# Patient Record
Sex: Female | Born: 1945
Health system: Southern US, Community
[De-identification: ages and names within clinical notes are randomized; demographics above are authoritative.]

## PROBLEM LIST (undated history)

## (undated) DIAGNOSIS — M1990 Unspecified osteoarthritis, unspecified site: Secondary | ICD-10-CM

## (undated) DIAGNOSIS — C801 Malignant (primary) neoplasm, unspecified: Secondary | ICD-10-CM

## (undated) DIAGNOSIS — Z85828 Personal history of other malignant neoplasm of skin: Secondary | ICD-10-CM

## (undated) DIAGNOSIS — E039 Hypothyroidism, unspecified: Secondary | ICD-10-CM

## (undated) DIAGNOSIS — F32A Depression, unspecified: Secondary | ICD-10-CM

## (undated) DIAGNOSIS — F329 Major depressive disorder, single episode, unspecified: Secondary | ICD-10-CM

## (undated) DIAGNOSIS — G5603 Carpal tunnel syndrome, bilateral upper limbs: Secondary | ICD-10-CM

## (undated) HISTORY — DX: Carpal tunnel syndrome, bilateral upper limbs: G56.03

---

## 1993-04-05 DIAGNOSIS — D229 Melanocytic nevi, unspecified: Secondary | ICD-10-CM

## 1993-04-05 HISTORY — DX: Melanocytic nevi, unspecified: D22.9

## 1996-01-08 DIAGNOSIS — C4491 Basal cell carcinoma of skin, unspecified: Secondary | ICD-10-CM

## 1996-01-08 HISTORY — DX: Basal cell carcinoma of skin, unspecified: C44.91

## 2000-01-04 DIAGNOSIS — D229 Melanocytic nevi, unspecified: Secondary | ICD-10-CM

## 2000-01-04 HISTORY — DX: Melanocytic nevi, unspecified: D22.9

## 2002-02-13 HISTORY — PX: BACK SURGERY: SHX140

## 2004-02-14 HISTORY — PX: KNEE ARTHROSCOPY: SHX127

## 2006-02-13 HISTORY — PX: CHOLECYSTECTOMY: SHX55

## 2007-05-16 DIAGNOSIS — C4492 Squamous cell carcinoma of skin, unspecified: Secondary | ICD-10-CM

## 2007-05-16 HISTORY — DX: Squamous cell carcinoma of skin, unspecified: C44.92

## 2009-04-07 DIAGNOSIS — C4492 Squamous cell carcinoma of skin, unspecified: Secondary | ICD-10-CM

## 2009-04-07 HISTORY — DX: Squamous cell carcinoma of skin, unspecified: C44.92

## 2012-08-07 DIAGNOSIS — R0789 Other chest pain: Secondary | ICD-10-CM

## 2012-10-08 ENCOUNTER — Encounter (INDEPENDENT_AMBULATORY_CARE_PROVIDER_SITE_OTHER): Payer: Self-pay | Admitting: *Deleted

## 2012-10-09 ENCOUNTER — Encounter (INDEPENDENT_AMBULATORY_CARE_PROVIDER_SITE_OTHER): Payer: Self-pay

## 2014-05-13 ENCOUNTER — Other Ambulatory Visit (INDEPENDENT_AMBULATORY_CARE_PROVIDER_SITE_OTHER): Payer: Self-pay | Admitting: Otolaryngology

## 2014-05-13 DIAGNOSIS — R221 Localized swelling, mass and lump, neck: Secondary | ICD-10-CM

## 2014-05-18 ENCOUNTER — Ambulatory Visit (HOSPITAL_COMMUNITY)
Admission: RE | Admit: 2014-05-18 | Discharge: 2014-05-18 | Disposition: A | Discharge status: Home / Self Care | Payer: Medicare Other | Source: Ambulatory Visit | Attending: Otolaryngology | Admitting: Otolaryngology

## 2014-05-18 DIAGNOSIS — R221 Localized swelling, mass and lump, neck: Secondary | ICD-10-CM | POA: Insufficient documentation

## 2014-05-18 LAB — POCT I-STAT CREATININE: Creatinine, Ser: 0.9 mg/dL (ref 0.50–1.10)

## 2014-05-18 IMAGING — CT CT NECK W/ CM
3 of 8 series · 10 of 33 positions shown, 11 images · IV contrast (Omnipaque 300)
Comparison: None.

CLINICAL DATA: Right neck mass.

EXAM:
CT NECK WITH CONTRAST
TECHNIQUE: Multidetector CT imaging of the neck was performed using the
standard protocol following the bolus administration of intravenous
contrast.
CONTRAST:  75mL OMNIPAQUE IOHEXOL 300 MG/ML  SOLN

[Series 2: soft tissue neck 2.0 b31s · axial · 0.47mm/px · z∈[+100,+232]mm · 4 of 111 slices shown, 5 images]
[im 23/111  soft-tissue]
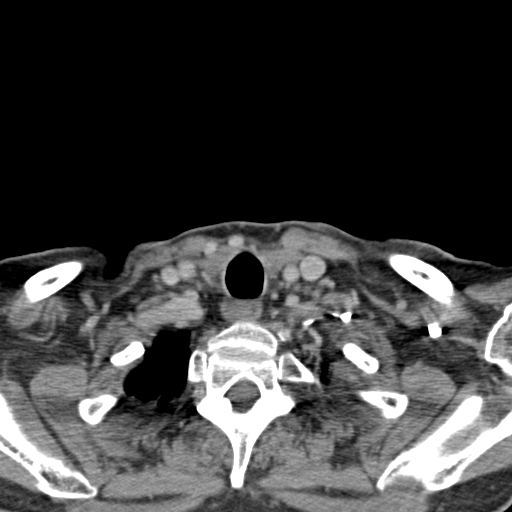
[im 23/111  bone]
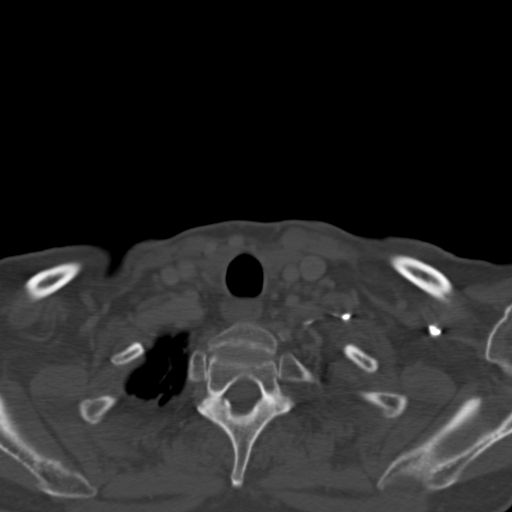
[im 45/111  bone]
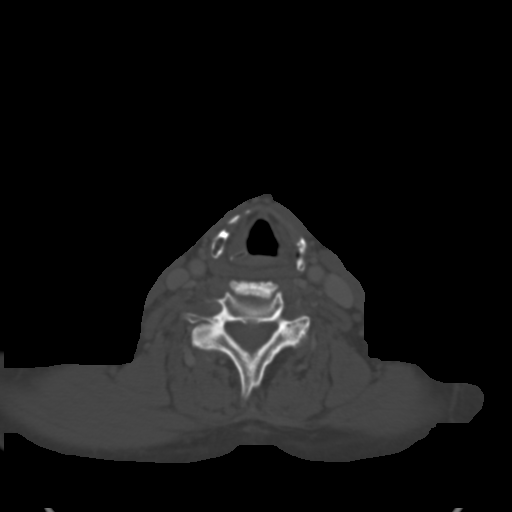
[im 67/111  bone]
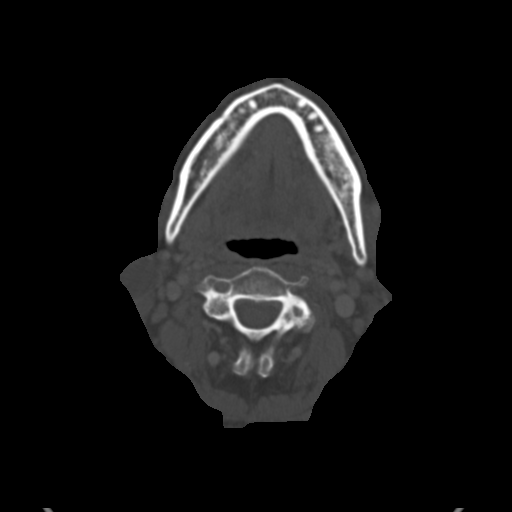
[im 89/111  bone]
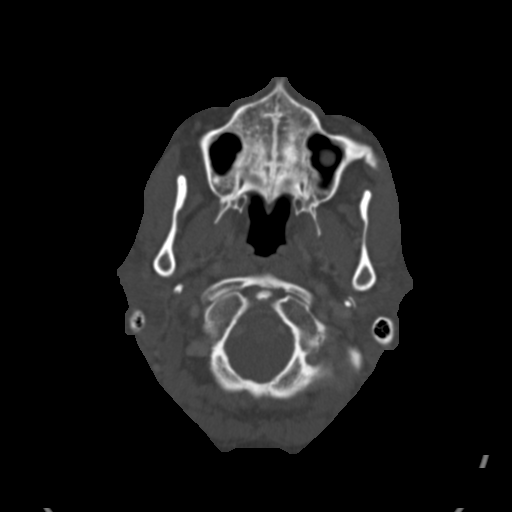

[Series 5: neck 2.0 soft tissue coro · coronal · 0.37mm/px · 1 of 115 slices shown]
[im 58/115  bone]
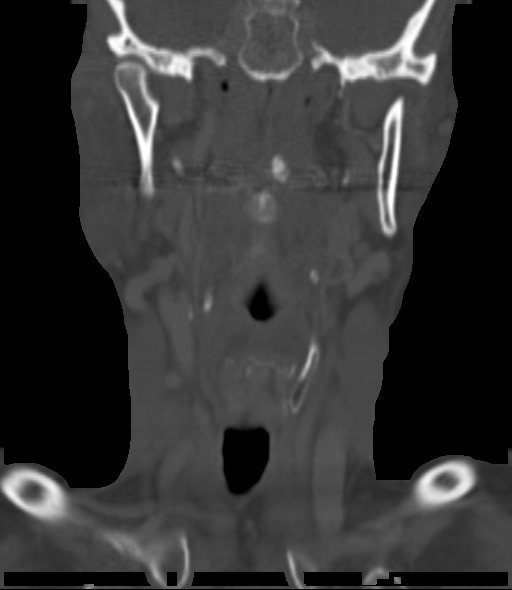

[Series 9: neck 2.0 soft tissue sag · sagittal · 0.14mm/px · 5 of 128 slices shown]
[im 19/128  bone]
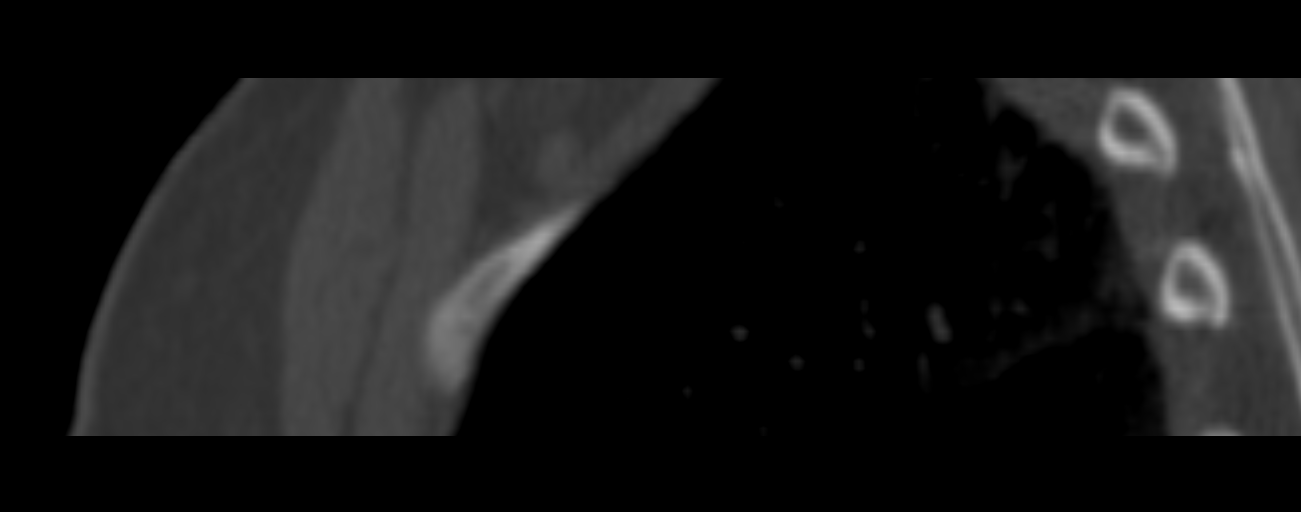
[im 37/128  bone]
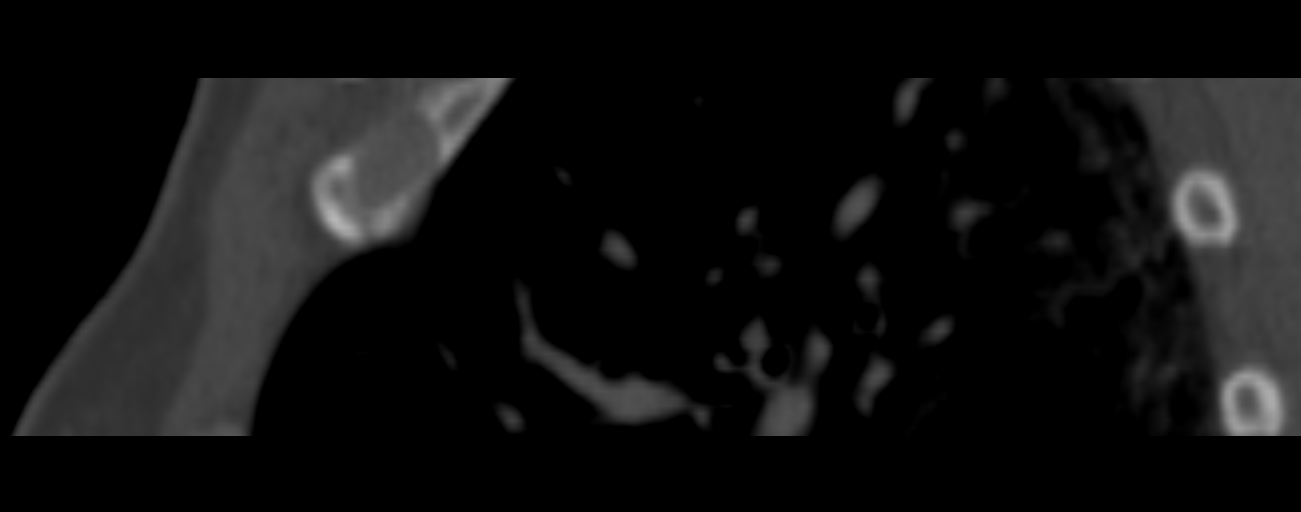
[im 55/128  bone]
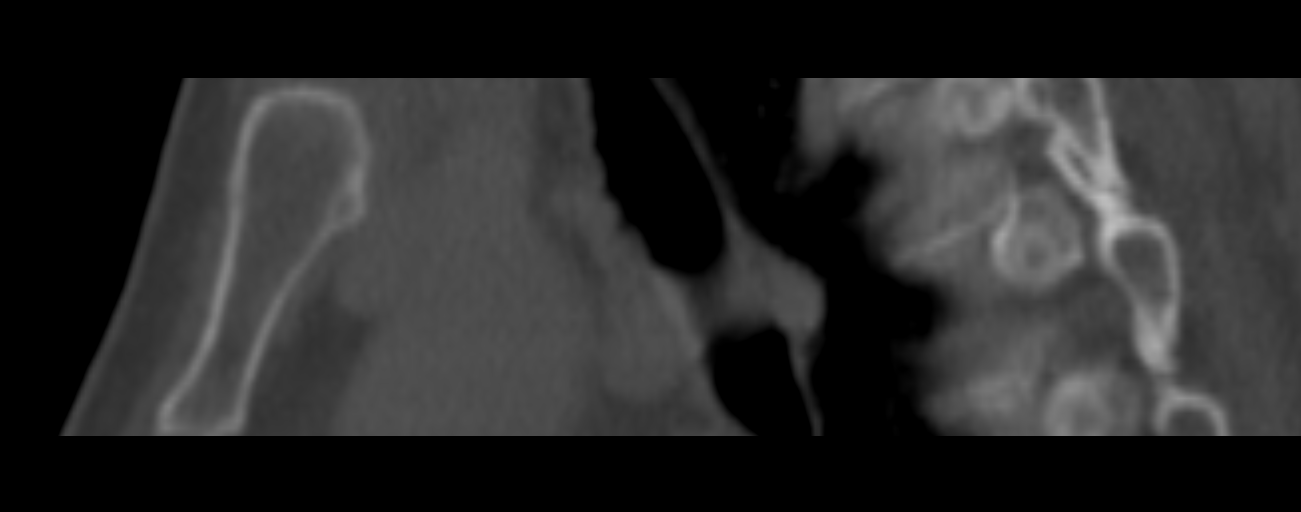
[im 73/128  bone]
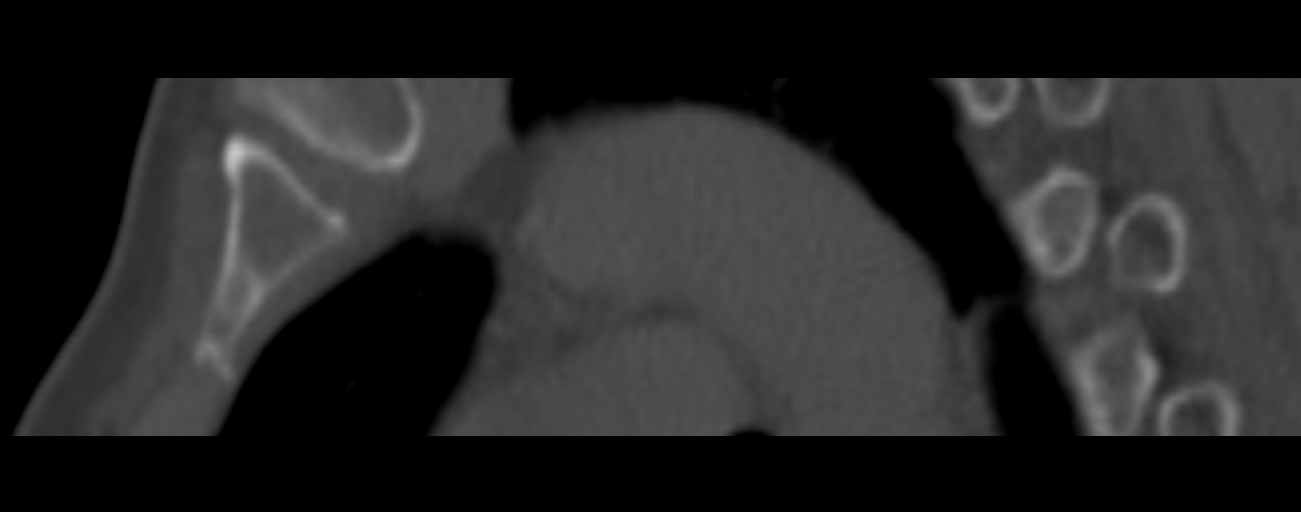
[im 91/128  bone]
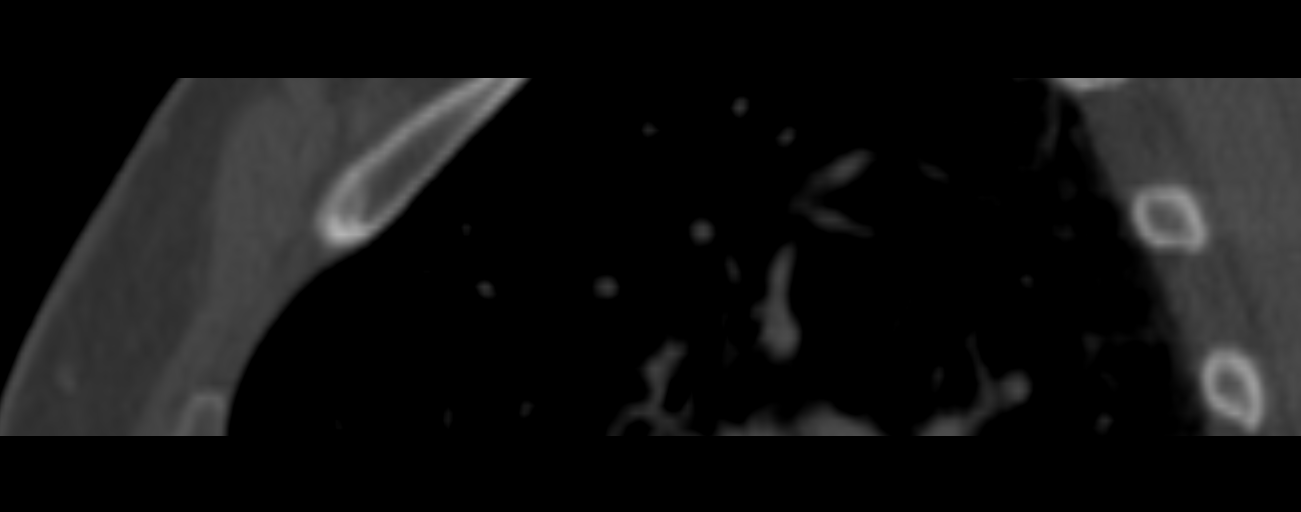

[10 of 33 positions shown; findings below may reference images not displayed]

FINDINGS: Pharynx and larynx: No focal mucosal or submucosal lesions are
present. The epiglottis is normal. The valleculae is clear. The
vocal cords are midline and symmetric.

Salivary glands: A peripherally enhancing low-density lesion is
present at the tail of the right parotid gland measuring 2.0 x 2.7 x
3.1 cm. There is no clear fat plane between this lesion in the
parotid gland. No other discrete parotid lesions are evident. The
submandibular glands are within normal limits bilaterally.

Thyroid: Negative

Lymph nodes: Multiple borderline posterior right level 2 lymph nodes
are present. A single borderline posterior left level 2 node
measures 11 mm in long axis. No significant lower adenopathy is
present.

Vascular: Mild atherosclerotic calcifications are present at the
carotid bifurcations bilaterally without significant stenoses.

Limited intracranial: Negative.

Visualized orbits: Within normal limits.

Mastoids and visualized paranasal sinuses: Clear.

Skeleton: Degenerative endplate changes are present at C5-6 and C6-7
with uncovertebral spurring bilaterally. No focal lytic or blastic
lesions are present.

Upper chest: Interlobular and pleural thickening is present at the
lung apices bilaterally, right greater than left. Mild emphysematous
changes are noted.
IMPRESSION: 1. 3.1 cm peripherally enhancing low-density lesion at the tail the
right parotid gland. This could represent a primary parotid lesion
such is benign mixed tumor or Warthin's tumor. There is no evidence
for malignancy aggressive features. This may represent a necrotic
lymph node. No other primary lesion is evident. The lesion is
somewhat high for and a second branchial cleft cyst. An infected
branchial cleft cyst is considered least likely.
2. Other borderline the posterior level 2 nodes are present
bilaterally, suggesting an inflammatory process.
3. Mild degenerative changes of the cervical spine at C5-6 and C6-7.

## 2014-05-18 MED ORDER — IOHEXOL 300 MG/ML  SOLN
75.0000 mL | Freq: Once | INTRAMUSCULAR | Status: AC | PRN
  Administered 2014-05-18: 75 mL via INTRAVENOUS

## 2014-05-21 ENCOUNTER — Ambulatory Visit (INDEPENDENT_AMBULATORY_CARE_PROVIDER_SITE_OTHER): Payer: Medicare Other | Admitting: Otolaryngology

## 2014-05-21 DIAGNOSIS — D37030 Neoplasm of uncertain behavior of the parotid salivary glands: Secondary | ICD-10-CM

## 2014-05-28 ENCOUNTER — Other Ambulatory Visit (HOSPITAL_COMMUNITY)
Admission: RE | Admit: 2014-05-28 | Discharge: 2014-05-28 | Disposition: A | Discharge status: Home / Self Care | Payer: Medicare Other | Source: Ambulatory Visit | Attending: Otolaryngology | Admitting: Otolaryngology

## 2014-05-28 ENCOUNTER — Ambulatory Visit (INDEPENDENT_AMBULATORY_CARE_PROVIDER_SITE_OTHER): Payer: Medicare Other | Admitting: Otolaryngology

## 2014-05-28 DIAGNOSIS — D37030 Neoplasm of uncertain behavior of the parotid salivary glands: Secondary | ICD-10-CM | POA: Diagnosis not present

## 2014-05-28 DIAGNOSIS — K118 Other diseases of salivary glands: Secondary | ICD-10-CM | POA: Diagnosis present

## 2014-07-02 ENCOUNTER — Ambulatory Visit (INDEPENDENT_AMBULATORY_CARE_PROVIDER_SITE_OTHER): Payer: Medicare Other | Admitting: Otolaryngology

## 2014-07-02 DIAGNOSIS — D37030 Neoplasm of uncertain behavior of the parotid salivary glands: Secondary | ICD-10-CM | POA: Diagnosis not present

## 2014-07-06 ENCOUNTER — Other Ambulatory Visit: Payer: Self-pay | Admitting: Otolaryngology

## 2014-07-15 ENCOUNTER — Encounter (HOSPITAL_BASED_OUTPATIENT_CLINIC_OR_DEPARTMENT_OTHER): Payer: Self-pay | Admitting: *Deleted

## 2014-07-20 ENCOUNTER — Ambulatory Visit (HOSPITAL_BASED_OUTPATIENT_CLINIC_OR_DEPARTMENT_OTHER): Payer: Medicare Other | Admitting: Anesthesiology

## 2014-07-20 ENCOUNTER — Encounter (HOSPITAL_BASED_OUTPATIENT_CLINIC_OR_DEPARTMENT_OTHER)
Admission: RE | Disposition: A | Discharge status: Home / Self Care | Payer: Self-pay | Source: Ambulatory Visit | Attending: Otolaryngology

## 2014-07-20 ENCOUNTER — Ambulatory Visit (HOSPITAL_BASED_OUTPATIENT_CLINIC_OR_DEPARTMENT_OTHER)
Admission: RE | Admit: 2014-07-20 | Discharge: 2014-07-21 | Disposition: A | Discharge status: Home / Self Care | Payer: Medicare Other | Source: Ambulatory Visit | Attending: Otolaryngology | Admitting: Otolaryngology

## 2014-07-20 ENCOUNTER — Encounter (HOSPITAL_BASED_OUTPATIENT_CLINIC_OR_DEPARTMENT_OTHER): Payer: Self-pay

## 2014-07-20 DIAGNOSIS — D37030 Neoplasm of uncertain behavior of the parotid salivary glands: Secondary | ICD-10-CM | POA: Diagnosis not present

## 2014-07-20 DIAGNOSIS — F329 Major depressive disorder, single episode, unspecified: Secondary | ICD-10-CM | POA: Diagnosis not present

## 2014-07-20 DIAGNOSIS — C07 Malignant neoplasm of parotid gland: Secondary | ICD-10-CM | POA: Diagnosis not present

## 2014-07-20 DIAGNOSIS — Z9889 Other specified postprocedural states: Secondary | ICD-10-CM

## 2014-07-20 DIAGNOSIS — Z9049 Acquired absence of other specified parts of digestive tract: Secondary | ICD-10-CM | POA: Insufficient documentation

## 2014-07-20 DIAGNOSIS — E039 Hypothyroidism, unspecified: Secondary | ICD-10-CM | POA: Insufficient documentation

## 2014-07-20 DIAGNOSIS — R221 Localized swelling, mass and lump, neck: Secondary | ICD-10-CM | POA: Diagnosis present

## 2014-07-20 HISTORY — PX: PAROTIDECTOMY: SHX2163

## 2014-07-20 HISTORY — DX: Hypothyroidism, unspecified: E03.9

## 2014-07-20 HISTORY — DX: Major depressive disorder, single episode, unspecified: F32.9

## 2014-07-20 HISTORY — DX: Depression, unspecified: F32.A

## 2014-07-20 LAB — POCT HEMOGLOBIN-HEMACUE: HEMOGLOBIN: 13.4 g/dL (ref 12.0–15.0)

## 2014-07-20 SURGERY — EXCISION, PAROTID GLAND
Anesthesia: General | Laterality: Right

## 2014-07-20 MED ORDER — FENTANYL CITRATE (PF) 100 MCG/2ML IJ SOLN
INTRAMUSCULAR | Status: DC | PRN
  Administered 2014-07-20: 100 ug via INTRAVENOUS
  Administered 2014-07-20: 50 ug via INTRAVENOUS

## 2014-07-20 MED ORDER — CEFAZOLIN SODIUM-DEXTROSE 2-3 GM-% IV SOLR
INTRAVENOUS | Status: DC | PRN
  Administered 2014-07-20: 2 g via INTRAVENOUS

## 2014-07-20 MED ORDER — FENTANYL CITRATE (PF) 100 MCG/2ML IJ SOLN
INTRAMUSCULAR | Status: AC
  Filled 2014-07-20: qty 6

## 2014-07-20 MED ORDER — LEVOTHYROXINE SODIUM 112 MCG PO TABS: 112.0000 ug | ORAL_TABLET | Freq: Every day | ORAL | Status: DC

## 2014-07-20 MED ORDER — FENTANYL CITRATE (PF) 100 MCG/2ML IJ SOLN
25.0000 ug | INTRAMUSCULAR | Status: DC | PRN
  Administered 2014-07-20: 50 ug via INTRAVENOUS

## 2014-07-20 MED ORDER — PHENYLEPHRINE HCL 10 MG/ML IJ SOLN
10.0000 mg | INTRAVENOUS | Status: DC | PRN
  Administered 2014-07-20: 50 ug/min via INTRAVENOUS

## 2014-07-20 MED ORDER — EPHEDRINE SULFATE 50 MG/ML IJ SOLN
INTRAMUSCULAR | Status: DC | PRN
  Administered 2014-07-20 (×3): 10 mg via INTRAVENOUS

## 2014-07-20 MED ORDER — MIDAZOLAM HCL 2 MG/2ML IJ SOLN
INTRAMUSCULAR | Status: AC
  Filled 2014-07-20: qty 2

## 2014-07-20 MED ORDER — OXYCODONE-ACETAMINOPHEN 5-325 MG PO TABS
1.0000 | ORAL_TABLET | ORAL | Status: DC | PRN
  Administered 2014-07-20: 2 via ORAL
  Filled 2014-07-20: qty 2

## 2014-07-20 MED ORDER — ONDANSETRON HCL 4 MG/2ML IJ SOLN
INTRAMUSCULAR | Status: DC | PRN
  Administered 2014-07-20: 4 mg via INTRAVENOUS

## 2014-07-20 MED ORDER — OXYCODONE-ACETAMINOPHEN 5-325 MG PO TABS: 1.0000 | ORAL_TABLET | ORAL | Status: DC | PRN

## 2014-07-20 MED ORDER — KCL IN DEXTROSE-NACL 20-5-0.45 MEQ/L-%-% IV SOLN
INTRAVENOUS | Status: DC
  Administered 2014-07-20: 14:00:00 via INTRAVENOUS
  Filled 2014-07-20: qty 1000

## 2014-07-20 MED ORDER — DEXAMETHASONE SODIUM PHOSPHATE 4 MG/ML IJ SOLN
INTRAMUSCULAR | Status: DC | PRN
  Administered 2014-07-20: 10 mg via INTRAVENOUS

## 2014-07-20 MED ORDER — OXYCODONE HCL 5 MG PO TABS: 5.0000 mg | ORAL_TABLET | Freq: Once | ORAL | Status: AC | PRN

## 2014-07-20 MED ORDER — ACETAMINOPHEN 325 MG PO TABS: 325.0000 mg | ORAL_TABLET | ORAL | Status: DC | PRN

## 2014-07-20 MED ORDER — FENTANYL CITRATE (PF) 100 MCG/2ML IJ SOLN
INTRAMUSCULAR | Status: AC
  Filled 2014-07-20: qty 2

## 2014-07-20 MED ORDER — PROPOFOL 10 MG/ML IV BOLUS
INTRAVENOUS | Status: AC
  Filled 2014-07-20: qty 20

## 2014-07-20 MED ORDER — AMOXICILLIN 875 MG PO TABS: 875.0000 mg | ORAL_TABLET | Freq: Two times a day (BID) | ORAL | Status: DC

## 2014-07-20 MED ORDER — GLYCOPYRROLATE 0.2 MG/ML IJ SOLN
INTRAMUSCULAR | Status: DC | PRN
  Administered 2014-07-20: 0.2 mg via INTRAVENOUS

## 2014-07-20 MED ORDER — MIDAZOLAM HCL 5 MG/5ML IJ SOLN
INTRAMUSCULAR | Status: DC | PRN
  Administered 2014-07-20: 2 mg via INTRAVENOUS

## 2014-07-20 MED ORDER — ACETAMINOPHEN 160 MG/5ML PO SOLN: 325.0000 mg | ORAL | Status: DC | PRN

## 2014-07-20 MED ORDER — MORPHINE SULFATE 2 MG/ML IJ SOLN: 2.0000 mg | INTRAMUSCULAR | Status: DC | PRN

## 2014-07-20 MED ORDER — OXYCODONE HCL 5 MG/5ML PO SOLN: 5.0000 mg | Freq: Once | ORAL | Status: AC | PRN

## 2014-07-20 MED ORDER — LIDOCAINE-EPINEPHRINE 1 %-1:100000 IJ SOLN
INTRAMUSCULAR | Status: DC | PRN
  Administered 2014-07-20: 2 mL

## 2014-07-20 MED ORDER — ZOLPIDEM TARTRATE 5 MG PO TABS: 5.0000 mg | ORAL_TABLET | Freq: Every evening | ORAL | Status: DC | PRN

## 2014-07-20 MED ORDER — ONDANSETRON HCL 4 MG/2ML IJ SOLN: 4.0000 mg | INTRAMUSCULAR | Status: DC | PRN

## 2014-07-20 MED ORDER — PROPOFOL 10 MG/ML IV BOLUS
INTRAVENOUS | Status: DC | PRN
  Administered 2014-07-20: 150 mg via INTRAVENOUS

## 2014-07-20 MED ORDER — LACTATED RINGERS IV SOLN
INTRAVENOUS | Status: DC
  Administered 2014-07-20 (×2): via INTRAVENOUS

## 2014-07-20 MED ORDER — SUCCINYLCHOLINE CHLORIDE 20 MG/ML IJ SOLN
INTRAMUSCULAR | Status: DC | PRN
  Administered 2014-07-20: 100 mg via INTRAVENOUS

## 2014-07-20 MED ORDER — VENLAFAXINE HCL 75 MG PO TABS: 75.0000 mg | ORAL_TABLET | Freq: Two times a day (BID) | ORAL | Status: DC

## 2014-07-20 MED ORDER — ONDANSETRON HCL 4 MG PO TABS: 4.0000 mg | ORAL_TABLET | ORAL | Status: DC | PRN

## 2014-07-20 SURGICAL SUPPLY — 60 items
APL SRG 3 HI ABS STRL LF PLS (MISCELLANEOUS) ×1
APPLICATOR DR MATTHEWS STRL (MISCELLANEOUS) ×3 IMPLANT
ATTRACTOMAT 16X20 MAGNETIC DRP (DRAPES) ×1 IMPLANT
BALL CTTN LRG ABS STRL LF (GAUZE/BANDAGES/DRESSINGS) ×1
BLADE SURG 15 STRL LF DISP TIS (BLADE) ×1 IMPLANT
BLADE SURG 15 STRL SS (BLADE) ×3
CANISTER SUCT 1200ML W/VALVE (MISCELLANEOUS) ×3 IMPLANT
CORDS BIPOLAR (ELECTRODE) ×3 IMPLANT
COTTONBALL LRG STERILE PKG (GAUZE/BANDAGES/DRESSINGS) ×3 IMPLANT
COVER BACK TABLE 60X90IN (DRAPES) ×3 IMPLANT
COVER MAYO STAND STRL (DRAPES) ×3 IMPLANT
DECANTER SPIKE VIAL GLASS SM (MISCELLANEOUS) ×3 IMPLANT
DRAIN CHANNEL 10F 3/8 F FF (DRAIN) ×2 IMPLANT
DRAPE SURG 17X23 STRL (DRAPES) ×4 IMPLANT
DRAPE U-SHAPE 76X120 STRL (DRAPES) ×3 IMPLANT
ELECT COATED BLADE 2.86 ST (ELECTRODE) ×3 IMPLANT
ELECT PAIRED SUBDERMAL (MISCELLANEOUS) ×3
ELECT REM PT RETURN 9FT ADLT (ELECTROSURGICAL) ×3
ELECTRODE PAIRED SUBDERMAL (MISCELLANEOUS) ×1 IMPLANT
ELECTRODE REM PT RTRN 9FT ADLT (ELECTROSURGICAL) ×1 IMPLANT
EVACUATOR SILICONE 100CC (DRAIN) ×2 IMPLANT
GAUZE SPONGE 4X4 16PLY XRAY LF (GAUZE/BANDAGES/DRESSINGS) ×3 IMPLANT
GLOVE BIO SURGEON STRL SZ 6.5 (GLOVE) IMPLANT
GLOVE BIO SURGEON STRL SZ7.5 (GLOVE) ×3 IMPLANT
GLOVE BIO SURGEONS STRL SZ 6.5 (GLOVE)
GLOVE BIOGEL PI IND STRL 7.0 (GLOVE) IMPLANT
GLOVE BIOGEL PI INDICATOR 7.0 (GLOVE) ×4
GLOVE ECLIPSE 6.5 STRL STRAW (GLOVE) ×2 IMPLANT
GOWN STRL REUS W/ TWL LRG LVL3 (GOWN DISPOSABLE) ×2 IMPLANT
GOWN STRL REUS W/TWL LRG LVL3 (GOWN DISPOSABLE) ×9
LIQUID BAND (GAUZE/BANDAGES/DRESSINGS) ×3 IMPLANT
LOCATOR NERVE 3 VOLT (DISPOSABLE) IMPLANT
NDL HYPO 25X1 1.5 SAFETY (NEEDLE) ×1 IMPLANT
NEEDLE HYPO 25X1 1.5 SAFETY (NEEDLE) ×3 IMPLANT
NS IRRIG 1000ML POUR BTL (IV SOLUTION) ×3 IMPLANT
PACK BASIN DAY SURGERY FS (CUSTOM PROCEDURE TRAY) ×3 IMPLANT
PAD ALCOHOL SWAB (MISCELLANEOUS) ×6 IMPLANT
PENCIL BUTTON HOLSTER BLD 10FT (ELECTRODE) ×3 IMPLANT
PIN SAFETY STERILE (MISCELLANEOUS) IMPLANT
PROBE NERVBE PRASS .33 (MISCELLANEOUS) ×3 IMPLANT
SHEARS HARMONIC 9CM CVD (BLADE) ×3 IMPLANT
SLEEVE SCD COMPRESS KNEE MED (MISCELLANEOUS) ×3 IMPLANT
SPONGE INTESTINAL PEANUT (DISPOSABLE) ×3 IMPLANT
STAPLER VISISTAT 35W (STAPLE) IMPLANT
SUT ETHILON 3 0 PS 1 (SUTURE) ×2 IMPLANT
SUT PROLENE 5 0 P 3 (SUTURE) ×3 IMPLANT
SUT SILK 2 0 FS (SUTURE) ×9 IMPLANT
SUT SILK 2 0 TIES 17X18 (SUTURE)
SUT SILK 2-0 18XBRD TIE BLK (SUTURE) IMPLANT
SUT SILK 3 0 TIES 17X18 (SUTURE) ×3
SUT SILK 3-0 18XBRD TIE BLK (SUTURE) ×1 IMPLANT
SUT SILK 4 0 TIES 17X18 (SUTURE) IMPLANT
SUT VIC AB 3-0 FS2 27 (SUTURE) IMPLANT
SUT VICRYL 4-0 PS2 18IN ABS (SUTURE) ×5 IMPLANT
SYR BULB 3OZ (MISCELLANEOUS) ×3 IMPLANT
SYR CONTROL 10ML LL (SYRINGE) ×3 IMPLANT
TOWEL OR 17X24 6PK STRL BLUE (TOWEL DISPOSABLE) ×5 IMPLANT
TRAY DSU PREP LF (CUSTOM PROCEDURE TRAY) ×3 IMPLANT
TUBE CONNECTING 20'X1/4 (TUBING) ×1
TUBE CONNECTING 20X1/4 (TUBING) ×2 IMPLANT

## 2014-07-20 NOTE — Discharge Instructions (Signed)
Parotidectomy Care After Refer to this sheet in the next few weeks. These instructions provide you with information on caring for yourself after your procedure. Your caregiver may also give you more specific instructions. Your treatment has been planned according to current medical practices, but problems sometimes occur. Call your caregiver if you have any problems or questions after your procedure. HOME CARE INSTRUCTIONS Wound care  Check your incision every day to make sure that it is not red.  Your stitches will be taken out after about 1 week. Pain  Some pain is normal after a parotidectomy. Take whatever pain medicine your surgeon prescribes. Follow the directions carefully. Diet  You can eat like you normally do once you are home. However, it might hurt to chew for a while. Stay away from foods that are hard to chew.  It may help to take your pain medicine about 30 minutes before you eat. Other precautions  Keep your head propped up when you lie down. Try using 2 pillows to do this. Do it for about 2 weeks after your surgery.  You can probably go back to your normal routine after a few days.However, do not do anything that requires great effort until your surgeon says it is okay. SEEK MEDICAL CARE IF:  You have any questions about your medicines.  Pain does not go away, even after taking pain medicine.  You vomit or feel nauseous. SEEK IMMEDIATE MEDICAL CARE IF:   You are taking pain medicine but your pain gets much worse.  Yourincision looks red and swollen or blood or fluid leaks from the wound.  Skin on your ear or face gets red and swollen.  Your face is numb or feels weak.  You have a fever. MAKE SURE YOU:  Understand these instructions.  Will watch your condition.  Will get help right away if you are not doing well or get worse. Document Released: 03/04/2010 Document Revised: 04/24/2011 Document Reviewed: 03/04/2010 [facility] Patient Information 2015  [place], [place]. This information is not intended to replace advice given to you by your health care provider. Make sure you discuss any questions you have with your health care provider.

## 2014-07-20 NOTE — Anesthesia Procedure Notes (Signed)
Procedure Name: Intubation Date/Time: 07/20/2014 10:08 AM Performed by: Maryella Shivers Pre-anesthesia Checklist: Patient identified, Emergency Drugs available, Suction available and Patient being monitored Patient Re-evaluated:Patient Re-evaluated prior to inductionOxygen Delivery Method: Circle System Utilized Preoxygenation: Pre-oxygenation with 100% oxygen Intubation Type: IV induction Ventilation: Mask ventilation without difficulty Laryngoscope Size: Mac and 3 Grade View: Grade I Tube type: Oral Tube size: 7.0 mm Number of attempts: 1 Airway Equipment and Method: Stylet and Oral airway Placement Confirmation: ETT inserted through vocal cords under direct vision,  positive ETCO2 and breath sounds checked- equal and bilateral Secured at: 21 cm Tube secured with: Tape Dental Injury: Teeth and Oropharynx as per pre-operative assessment

## 2014-07-20 NOTE — H&P (Signed)
H&P Update  Pt's original H&P dated 07/02/14 reviewed and placed in chart (to be scanned).  I personally examined the patient today.  No change in health. Proceed with right lateral parotidectomy.

## 2014-07-20 NOTE — Op Note (Signed)
DATE OF PROCEDURE:  07/20/2014                              OPERATIVE REPORT  SURGEON:  Leta Baptist, MD  ASSISTANT SURGEON: Rometta Emery, PA-C  PREOPERATIVE DIAGNOSES: Right parotid mass.  POSTOPERATIVE DIAGNOSES: Right parotid mass.  PROCEDURE PERFORMED:  Right lateral parotidectomy with facial nerve dissection  ANESTHESIA:  General endotracheal tube anesthesia.  COMPLICATIONS:  None.  ESTIMATED BLOOD LOSS:  Minimal.  INDICATION FOR PROCEDURE:  Maria Rogers is a 69 y.o. female with a history of a large 3 cm right parotid mass. Her FNA biopsy was negative for malignancy. However due to the large size of the mass, the patient would like to have the mass removed. The risks, benefits, alternatives, and details of the procedure were discussed with the patient.  Questions were invited and answered.  Informed consent was obtained.  DESCRIPTION:  The patient was taken to the operating room and placed supine on the operating table.  General endotracheal tube anesthesia was administered by the anesthesiologist.  Preop IV antibiotics was given.  The patient was positioned and prepped and draped in the standard fashion for right parotidectomy surgery.   Facial nerve monitoring electrodes were placed. The facial nerve monitoring system was functional throughout the case. 1% lidocaine with 1-100,000 epinephrine was injected at the planned site of incision. A curved S-shaped facelift incision was made in a standard fashion. The SMAS flap was elevated. A 3 cm tail of the right parotid mass was noted. Dissection was carried out in the preauricular space, down to the level of the facial nerve. The main trunk of the facial nerve and the branches of the right facial nerve were identified and dissected free from the parotid tissue. All branches of the facial nerve were preserved. The entire mass was then dissected free from the rest of the parotid gland. The specimen was sent to the pathology department for  permanent histologic identification. A #10 JP drain was placed. The incision was closed in layers with 4-0 Vicryls and Dermabond.   The care of the patient was turned over to the anesthesiologist.  The patient was awakened from anesthesia without difficulty.  The patient was extubated and transferred to the recovery room in good condition.  OPERATIVE FINDINGS: A 3 cm right parotid mass was noted and removed.  The right facial nerve was identified and preserved.   SPECIMEN:  Right parotid mass   FOLLOWUP CARE:  The patient will be observed overnight in the surgery center. She will most likely be discharged home on postop day #1. She will follow up in my office in one week.   Maria Rogers 07/20/2014 12:02 PM

## 2014-07-20 NOTE — Anesthesia Postprocedure Evaluation (Signed)
Anesthesia Post Note  Patient: Maria Rogers  Procedure(s) Performed: Procedure(s) (LRB): PAROTIDECTOMY (Right)  Anesthesia type: General  Patient location: PACU  Post pain: Pain level controlled  Post assessment: Post-op Vital signs reviewed  Last Vitals: BP 141/77 mmHg  Pulse 78  Temp(Src) 36.7 C (Oral)  Resp 16  Ht 5\' 8"  (1.727 m)  Wt 167 lb (75.751 kg)  BMI 25.40 kg/m2  SpO2 95%  Post vital signs: Reviewed  Level of consciousness: sedated  Complications: No apparent anesthesia complications

## 2014-07-20 NOTE — Anesthesia Preprocedure Evaluation (Signed)
Anesthesia Evaluation  Patient identified by MRN, date of birth, ID band Patient awake    Reviewed: Allergy & Precautions, NPO status , Patient's Chart, lab work & pertinent test results  History of Anesthesia Complications Negative for: history of anesthetic complications  Airway Mallampati: II  TM Distance: >3 FB Neck ROM: Full    Dental  (+) Teeth Intact   Pulmonary neg pulmonary ROS,  breath sounds clear to auscultation        Cardiovascular negative cardio ROS  Rhythm:Regular     Neuro/Psych PSYCHIATRIC DISORDERS Depression negative neurological ROS     GI/Hepatic Neg liver ROS,   Endo/Other  Hypothyroidism   Renal/GU      Musculoskeletal   Abdominal   Peds  Hematology negative hematology ROS (+)   Anesthesia Other Findings   Reproductive/Obstetrics                             Anesthesia Physical Anesthesia Plan  ASA: II  Anesthesia Plan: General   Post-op Pain Management:    Induction: Intravenous  Airway Management Planned: Oral ETT  Additional Equipment: None  Intra-op Plan:   Post-operative Plan: Extubation in OR  Informed Consent: I have reviewed the patients History and Physical, chart, labs and discussed the procedure including the risks, benefits and alternatives for the proposed anesthesia with the patient or authorized representative who has indicated his/her understanding and acceptance.   Dental advisory given  Plan Discussed with: CRNA and Surgeon  Anesthesia Plan Comments:         Anesthesia Quick Evaluation

## 2014-07-20 NOTE — Transfer of Care (Signed)
Immediate Anesthesia Transfer of Care Note  Patient: Maria Rogers  Procedure(s) Performed: Procedure(s): PAROTIDECTOMY (Right)  Patient Location: PACU  Anesthesia Type:General  Level of Consciousness: awake, alert  and oriented  Airway & Oxygen Therapy: Patient Spontanous Breathing and Patient connected to face mask oxygen  Post-op Assessment: Report given to RN and Post -op Vital signs reviewed and stable  Post vital signs: Reviewed and stable  Last Vitals:  Filed Vitals:   07/20/14 1242  BP:   Pulse: 82  Temp:   Resp: 9    Complications: No apparent anesthesia complications

## 2014-07-21 ENCOUNTER — Encounter (HOSPITAL_BASED_OUTPATIENT_CLINIC_OR_DEPARTMENT_OTHER): Payer: Self-pay | Admitting: Otolaryngology

## 2014-07-21 NOTE — Discharge Summary (Signed)
Physician Discharge Summary  Patient ID: Maria Rogers MRN: [id number] DOB/AGE: [birth year] 69 y.o.  Admit date: 07/20/2014 Discharge date: 07/21/2014  Admission Diagnoses: Right parotid mass  Discharge Diagnoses: Right parotid mass Active Problems:   H/O superficial parotidectomy   Discharged Condition: good  Hospital Course: Pt had an uneventful overnight stay. Pt tolerated po well. No bleeding. No stridor. Facial nerve function intact bilaterally.  Consults: None  Significant Diagnostic Studies: None  Treatments: surgery: Right lateral parotidectomy  Discharge Exam: Blood pressure 126/77, pulse 72, temperature 97.7 F (36.5 C), temperature source Oral, resp. rate 16, height 5\' 8"  (1.727 m), weight 75.751 kg (167 lb), SpO2 99 %. Incision c/d/i CN 7 intact  Disposition: Final discharge disposition not confirmed  Discharge Instructions    Activity as tolerated - No restrictions    Complete by:  As directed      Diet general    Complete by:  As directed             Medication List    TAKE these medications        amoxicillin 875 MG tablet  Commonly known as:  AMOXIL  Take 1 tablet (875 mg total) by mouth 2 (two) times daily.     levothyroxine 112 MCG tablet  Commonly known as:  SYNTHROID, LEVOTHROID  Take 112 mcg by mouth daily before breakfast.     oxyCODONE-acetaminophen 5-325 MG per tablet  Commonly known as:  ROXICET  Take 1-2 tablets by mouth every 4 (four) hours as needed for severe pain.     venlafaxine 75 MG tablet  Commonly known as:  EFFEXOR  Take 75 mg by mouth 2 (two) times daily with a meal.           Follow-up Information    Follow up with Ascencion Dike, MD On 07/30/2014.   Specialty:  Otolaryngology   Why:  ay 3:50pm   Contact information:   [address] [place]  [address] [phone]       Signed: Ascencion Dike 07/21/2014, 8:05 AM

## 2014-07-22 ENCOUNTER — Other Ambulatory Visit (INDEPENDENT_AMBULATORY_CARE_PROVIDER_SITE_OTHER): Payer: Self-pay | Admitting: Otolaryngology

## 2014-07-22 DIAGNOSIS — K118 Other diseases of salivary glands: Secondary | ICD-10-CM

## 2014-07-23 ENCOUNTER — Ambulatory Visit (INDEPENDENT_AMBULATORY_CARE_PROVIDER_SITE_OTHER): Payer: Medicare Other | Admitting: Otolaryngology

## 2014-07-23 DIAGNOSIS — C770 Secondary and unspecified malignant neoplasm of lymph nodes of head, face and neck: Secondary | ICD-10-CM

## 2014-07-23 DIAGNOSIS — H6121 Impacted cerumen, right ear: Secondary | ICD-10-CM | POA: Diagnosis not present

## 2014-07-30 ENCOUNTER — Ambulatory Visit (HOSPITAL_COMMUNITY)
Admission: RE | Admit: 2014-07-30 | Discharge: 2014-07-30 | Disposition: A | Discharge status: Home / Self Care | Payer: Medicare Other | Source: Ambulatory Visit | Attending: Otolaryngology | Admitting: Otolaryngology

## 2014-07-30 DIAGNOSIS — K118 Other diseases of salivary glands: Secondary | ICD-10-CM

## 2014-07-30 DIAGNOSIS — C07 Malignant neoplasm of parotid gland: Secondary | ICD-10-CM | POA: Diagnosis not present

## 2014-07-30 LAB — GLUCOSE, CAPILLARY: GLUCOSE-CAPILLARY: 97 mg/dL (ref 65–99)

## 2014-07-30 MED ORDER — FLUDEOXYGLUCOSE F - 18 (FDG) INJECTION
8.3600 | Freq: Once | INTRAVENOUS | Status: AC | PRN
  Administered 2014-07-30: 8.36 via INTRAVENOUS

## 2014-08-03 ENCOUNTER — Encounter (HOSPITAL_COMMUNITY): Payer: Self-pay | Admitting: Hematology & Oncology

## 2014-08-03 ENCOUNTER — Encounter (HOSPITAL_COMMUNITY): Payer: Medicare Other | Attending: Hematology & Oncology | Admitting: Hematology & Oncology

## 2014-08-03 VITALS — BP 154/74 | HR 77 | Temp 98.7°F | Resp 16 | Ht 68.0 in | Wt 167.6 lb

## 2014-08-03 DIAGNOSIS — Z85828 Personal history of other malignant neoplasm of skin: Secondary | ICD-10-CM

## 2014-08-03 DIAGNOSIS — C07 Malignant neoplasm of parotid gland: Secondary | ICD-10-CM | POA: Diagnosis not present

## 2014-08-03 DIAGNOSIS — C4491 Basal cell carcinoma of skin, unspecified: Secondary | ICD-10-CM

## 2014-08-03 NOTE — Progress Notes (Signed)
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 It is uncertain if this was a primary versus metastatic disease. Prior to surgical resection on 6/6 she underwent an FNA that was benign. The patient also notes prior to seeing Dr. Benjamine Mola she was treated with 2 courses of Antibiotics with no improvements in the size of the mass. She was presented at head and neck tumor Board in [place] where evaluation for other sites of disease was recommended. PET CT was performed on 07/30/2014 with no evidence of nodal metastases within the neck, no evidence of distant metastases. There was evidence of post excisional biopsy inflammation, and a round lesion was noted in the left parotid gland that had no metabolic activity and was considered to be benign. Additional workup was recommended. She follows currently with Dr. Benjamine Mola.   She is present today with her son Legrand Como.  She says that she feels no pain and has done well with surgery.  She says that she otherwise feels well.  She has had several basal skin cancer's all removed. Her most recent was 2 weeks ago, Dr. Denna Haggard, Dermatologist.  Doctors in [place] have done them as well.  She says that she has had a lot of sun exposure as a child.  She is fair skinned with blue eyes. She has had "freezing" done in several areas of her face including in the middle of her scalp. She denies any history of squamous cell carcinoma of the skin.  She has experienced mild weight loss due to change of her thyroid medicine; she has been taking it since 1993.  Last saw Dr. Benjamine Mola, ENT,  early last week. She notes she was advised her surgery was complete.   She has yearly mammograms She has had a colonscopy, Dr. Britta Mccreedy, [place] States that she has never had an EGD  She is here today for additional discussion and treatment planning in regards to her newly diagnosed squamous cell carcinoma. Pathology reveals a squamous cell carcinoma felt to be metastatic and involving a lymph node.  MEDICAL HISTORY:  Past Medical History  Diagnosis Date  . Hypothyroidism   . Depression   . Parotid mass 2016    SURGICAL HISTORY: Past Surgical History  Procedure Laterality Date  . Back surgery  2004    2 disc in neck  . Cholecystectomy  2008    lap chole  . Knee arthroscopy Right 2006  . Parotidectomy Right 07/20/2014    Procedure: PAROTIDECTOMY;  Surgeon: Leta Baptist, MD;  Location: [facility];  Service: ENT;  Laterality: Right;    SOCIAL HISTORY: History   Social History  . Marital Status: Married    Spouse Name: N/A  . Number of Children: N/A  . Years of Education: N/A   Occupational History  . Not on file.   Social History Main Topics  . Smoking status: Never Smoker   . Smokeless tobacco: Never Used  . Alcohol Use: No  . Drug Use: No  . Sexual Activity: Not Currently   Other Topics Concern  . 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 Cancer Mother    indicated that her mother is deceased. She indicated that her father is deceased. She indicated that her sister is deceased. She indicated that her brother is alive. She indicated that her maternal grandmother is deceased. She indicated that her maternal grandfather is deceased. She indicated that her paternal grandmother is deceased. She indicated that her paternal grandfather is deceased.   Mother deceased, 91, colon cancer, Basal Cell Carinoma Father deceaed, 71, heart attack and thyroid cancer Sister deceased 21, pulmonary edema, ETOH problems Brother living 3 children, 6 grandchildren   ALLERGIES:  has No Known Allergies.  MEDICATIONS:  Current Outpatient Prescriptions  Medication Sig Dispense Refill  . acetaminophen (TYLENOL) 325 MG tablet Take 650 mg by mouth at bedtime as needed.    Marland Kitchen levothyroxine (SYNTHROID, LEVOTHROID) 112 MCG tablet Take 112 mcg by mouth daily before breakfast.    . venlafaxine (EFFEXOR) 75 MG tablet Take 75 mg by mouth 2 (two) times daily with a meal.    . amoxicillin (AMOXIL) 875 MG tablet Take 1 tablet (875 mg total) by mouth 2 (two) times daily. (Patient not taking: Reported on 08/03/2014) 10 tablet 0  . oxyCODONE-acetaminophen (ROXICET) 5-325 MG per tablet Take 1-2 tablets by mouth every 4 (four) hours as needed for severe pain. (Patient not taking: Reported on 08/03/2014) 30 tablet 0   No current facility-administered medications for this visit.    Review of Systems  Constitutional: Positive for weight loss. Negative for fever, chills, malaise/fatigue and diaphoresis.       Due to change in Thyroid medication  HENT: Negative.   Eyes: Negative.   Respiratory: Negative.   Cardiovascular: Negative.   Gastrointestinal: Negative.   Genitourinary: Negative.   Musculoskeletal: Negative.   Skin: Negative.   Neurological: Negative.  Negative for weakness.  Endo/Heme/Allergies: Negative.   Psychiatric/Behavioral: Negative.   All other systems reviewed and are negative.  14 point ROS was done and is otherwise as detailed above or in HPI   PHYSICAL EXAMINATION: ECOG PERFORMANCE STATUS: 0 - Asymptomatic  Filed Vitals:   08/03/14 1452  BP: 154/74  Pulse: 77  Temp: 98.7 F (37.1 C)  Resp: 16   Filed Weights   08/03/14 1452  Weight: 167 lb 9.6 oz (76.023 kg)    Physical Exam  Constitutional: She is oriented to person, place, and  time and well-developed, well-nourished, and in no distress.  HENT:  Head: Normocephalic and atraumatic.  Mouth/Throat: Oropharynx is clear and moist.  Eyes: Conjunctivae and EOM are normal. Pupils are equal, round, and reactive to light.  Neck: Normal range of motion. Neck supple.  Right incision posterior ear down into upper neck, feeling is numb  Cardiovascular: Normal rate, regular rhythm, normal heart sounds and intact distal pulses.   Pulmonary/Chest: Effort normal and breath sounds normal.  Abdominal: Soft. Bowel sounds are normal.  Musculoskeletal: Normal range of motion.  Lymphadenopathy:       Head (right side): No submental, no submandibular, no preauricular and no posterior auricular adenopathy present.       Head (left side): No submental, no submandibular, no preauricular and no posterior auricular adenopathy present.    She has no cervical adenopathy.    She has no axillary adenopathy.       Right: No supraclavicular and  no epitrochlear adenopathy present.       Left: No supraclavicular and no epitrochlear adenopathy present.  Neurological: She is alert and oriented to person, place, and time.  Skin: Skin is warm and dry.  Skin of scalp was examined without evidence of any skin lesions/abnormalities  Psychiatric: Mood, memory, affect and judgment normal.  Nursing note and vitals reviewed.   LABORATORY DATA:  I have reviewed the data as listed Lab Results  Component Value Date   HGB 13.4 07/20/2014   RADIOGRAPHIC STUDIES: I have personally reviewed the radiological images as listed and agreed with the findings in the report.  CLINICAL DATA: Right neck mass.  EXAM: CT NECK WITH CONTRAST  TECHNIQUE: Multidetector CT imaging of the neck was performed using the standard protocol following the bolus administration of intravenous contrast.  CONTRAST: 55mL OMNIPAQUE IOHEXOL 300 MG/ML SOLN  COMPARISON: None.  FINDINGS: Pharynx and larynx: No focal  mucosal or submucosal lesions are present. The epiglottis is normal. The valleculae is clear. The vocal cords are midline and symmetric.  Salivary glands: A peripherally enhancing low-density lesion is present at the tail of the right parotid gland measuring 2.0 x 2.7 x 3.1 cm. There is no clear fat plane between this lesion in the parotid gland. No other discrete parotid lesions are evident. The submandibular glands are within normal limits bilaterally.  Thyroid: Negative  Lymph nodes: Multiple borderline posterior right level 2 lymph nodes are present. A single borderline posterior left level 2 node measures 11 mm in long axis. No significant lower adenopathy is present.  Vascular: Mild atherosclerotic calcifications are present at the carotid bifurcations bilaterally without significant stenoses.  Limited intracranial: Negative.  Visualized orbits: Within normal limits.  Mastoids and visualized paranasal sinuses: Clear.  Skeleton: Degenerative endplate changes are present at C5-6 and C6-7 with uncovertebral spurring bilaterally. No focal lytic or blastic lesions are present.  Upper chest: Interlobular and pleural thickening is present at the lung apices bilaterally, right greater than left. Mild emphysematous changes are noted.  IMPRESSION: 1. 3.1 cm peripherally enhancing low-density lesion at the tail the right parotid gland. This could represent a primary parotid lesion such is benign mixed tumor or Warthin's tumor. There is no evidence for malignancy aggressive features. This may represent a necrotic lymph node. No other primary lesion is evident. The lesion is somewhat high for and a second branchial cleft cyst. An infected branchial cleft cyst is considered least likely. 2. Other borderline the posterior level 2 nodes are present bilaterally, suggesting an inflammatory process. 3. Mild degenerative changes of the cervical spine at C5-6 and  C6-7.   Electronically Signed  By: San Morelle M.D.  On: 05/18/2014 11:07   CLINICAL DATA: Initial treatment strategy for parotid gland neoplasm. Squamous cell carcinoma the right parotid gland  EXAM: NUCLEAR MEDICINE PET SKULL BASE TO THIGH  TECHNIQUE: 8.4 mCi F-18 FDG was injected intravenously. Full-ring PET imaging was performed from the skull base to thigh after the radiotracer. CT data was obtained and used for attenuation correction and anatomic localization.  FASTING BLOOD GLUCOSE: Value: 97 mg/dl  COMPARISON: Neck CT 05/18/2014  FINDINGS: NECK  There is diffuse uptake within the right parotid gland likely related to excisional biopsy. No hypermetabolic nodes in the left or right neck.  15 mm rounded lesion the inferior/tail of the left parotid gland on image 25, series 4. This does not have metabolic activity above normal probable activity.  There is hypermetabolic activity associated with the right scalene muscles  which is felt to relate to normal muscle activity.  CHEST  No hypermetabolic mediastinal or hilar nodes. No suspicious pulmonary nodules on the CT scan.  ABDOMEN/PELVIS  No abnormal hypermetabolic activity within the liver, pancreas, adrenal glands, or spleen. No hypermetabolic lymph nodes in the abdomen or pelvis.  SKELETON  No focal hypermetabolic activity to suggest skeletal metastasis.  IMPRESSION: 1. No evidence of nodal metastasis within the neck. 2. No evidence of distant metastasis on the whole-body scan. 3. Uptake in the right parotid gland consistent with post excisional biopsy inflammation. 4. Round lesion within the left parotid gland does not have metabolic activity above background which is suggestive of benign etiology. Consider ultrasound and potential biopsy to further characterize this left parotid lesion.   Electronically Signed  By: Suzy Bouchard M.D.  On: 07/30/2014  10:27    PATHOLOGY:  Diagnosis Parotid gland, right - SQUAMOUS CELL CARCINOMA - SEE COMMENT Microscopic Comment The specimen reveals squamous cell carcinoma with cystic change. There is abundant lymphoid tissue in the background with reactive germinal centers, as highlighted by stains for BCL2, BCL6, CD5, CD10, CD20, and CD34 stains. Stains for p16 (HPV marker) and CD68 are negative. The squamous cells are highlighted by stains for p63, cytokeratin 5/6, cytokeratin 7, and cytokeratin 903. The abundant lymphoid material suggests that this lesion may represent metastatic squamous cell carcinoma involving a lymph node. Dr. Gari Crown has reviewed selected slides and concurs with this interpretation. The case was discussed with Dr. Benjamine Mola on 07/22/2014. Enid Cutter MD Pathologist, Electronic Signature (Case signed 07/22/2014)   ASSESSMENT & PLAN:  [id number] squamous cell carcinoma parotid gland/lymph node PET/CT negative for evidence of other disease History of basal cell carcinoma of the skin   68 year old female diagnosed with squamous cell carcinoma of the right parotid gland. Pathology suggests metastatic squamous cell carcinoma involving a lymph node. Primary site has not been found based on PET CT imaging. She did undergo endoscopy with Dr. Benjamine Mola. I do not believe she underwent an exam under anesthesia, nor am I sure that this is necessary.  She has a history of skin cancer but only basal cell carcinoma. She denies any history of squamous cell carcinoma of the skin. We are going to try to obtain records from her basal cell carcinoma resections.  I do not see any indication for chemotherapy in this setting. I think a referral to radiation oncology however is warranted. I discussed with the patient some of the uncertainties given her pathology and presentation. She has a very good understanding of this. She will need close observation moving forward.  Plan for today will be referral to radiation  oncology. I will discuss her with Dr. Isidore Moos prior to sending her to radiation oncology in [place]. I feel that presenting her in tumor Board at completion of radiation in regards to recommendations for ongoing follow-up is reasonable as well. I will plan on seeing her back in one month at which time we can see how she is doing and again discuss how to proceed moving forward.  All questions were answered. The patient knows to call the clinic with any problems, questions or concerns.   This note was electronically signed.   This document serves as a record of services personally performed by Ancil Linsey, MD. It was created on her behalf by Janace Hoard, a trained medical scribe. The creation of this record is based on the scribe's personal observations and the provider's statements to them. This document has been checked and approved  by the attending provider.  I have reviewed the above documentation for accuracy and completeness, and I agree with the above.  Kelby Fam. Whitney Muse, MD

## 2014-08-03 NOTE — Patient Instructions (Signed)
[facility] at [facility] Discharge Instructions  RECOMMENDATIONS MADE BY THE CONSULTANT AND ANY TEST RESULTS WILL BE SENT TO YOUR REFERRING PHYSICIAN.  Exam completed by Dr Whitney Muse today. Follow up in 1 month to see the doctor. Please call the clinic if you have any questions or concerns.  Thank you for choosing [facility] at [facility] to provide your oncology and hematology care.  To afford each patient quality time with our provider, please arrive at least 15 minutes before your scheduled appointment time.    You need to re-schedule your appointment should you arrive 10 or more minutes late.  We strive to give you quality time with our providers, and arriving late affects you and other patients whose appointments are after yours.  Also, if you no show three or more times for appointments you may be dismissed from the clinic at the providers discretion.     Again, thank you for choosing [facility].  Our hope is that these requests will decrease the amount of time that you wait before being seen by our physicians.       _____________________________________________________________  Should you have questions after your visit to [facility], please contact our office at [phone] between the hours of 8:30 a.m. and 4:30 p.m.  Voicemails left after 4:30 p.m. will not be returned until the following business day.  For prescription refill requests, have your pharmacy contact our office.

## 2014-08-10 ENCOUNTER — Other Ambulatory Visit (HOSPITAL_COMMUNITY): Payer: Self-pay | Admitting: Hematology & Oncology

## 2014-08-10 ENCOUNTER — Encounter (HOSPITAL_COMMUNITY): Payer: Self-pay | Admitting: Lab

## 2014-08-10 NOTE — Progress Notes (Signed)
Referral sent to [facility].  Records faxed on 6/27

## 2014-08-24 ENCOUNTER — Encounter (HOSPITAL_COMMUNITY): Payer: Self-pay | Admitting: Lab

## 2014-08-24 NOTE — Progress Notes (Signed)
Referrral to Rad Onc to [facility].  Records faxed on 7/7

## 2014-09-01 ENCOUNTER — Telehealth (HOSPITAL_COMMUNITY): Payer: Self-pay

## 2014-09-01 NOTE — Telephone Encounter (Signed)
08/31/14     Called patient on home and mobile numbers and left msgs. to call Dental Medicine to schedule Dental Consult w/Dr. Enrique Sack.   LRI

## 2014-09-02 ENCOUNTER — Ambulatory Visit (HOSPITAL_COMMUNITY): Payer: Medicare Other | Admitting: Hematology & Oncology

## 2014-09-04 ENCOUNTER — Encounter (INDEPENDENT_AMBULATORY_CARE_PROVIDER_SITE_OTHER): Payer: Self-pay

## 2014-09-04 ENCOUNTER — Ambulatory Visit (HOSPITAL_COMMUNITY): Payer: Self-pay | Admitting: Dentistry

## 2014-09-04 ENCOUNTER — Encounter (HOSPITAL_COMMUNITY): Payer: Self-pay | Admitting: Dentistry

## 2014-09-04 ENCOUNTER — Other Ambulatory Visit (HOSPITAL_COMMUNITY): Payer: Self-pay | Admitting: Dentistry

## 2014-09-04 VITALS — BP 144/72 | HR 65 | Temp 98.5°F

## 2014-09-04 DIAGNOSIS — M270 Developmental disorders of jaws: Secondary | ICD-10-CM

## 2014-09-04 DIAGNOSIS — C07 Malignant neoplasm of parotid gland: Secondary | ICD-10-CM | POA: Diagnosis not present

## 2014-09-04 DIAGNOSIS — Z9889 Other specified postprocedural states: Secondary | ICD-10-CM

## 2014-09-04 DIAGNOSIS — M278 Other specified diseases of jaws: Secondary | ICD-10-CM

## 2014-09-04 DIAGNOSIS — K030 Excessive attrition of teeth: Secondary | ICD-10-CM

## 2014-09-04 DIAGNOSIS — K08409 Partial loss of teeth, unspecified cause, unspecified class: Secondary | ICD-10-CM

## 2014-09-04 DIAGNOSIS — K036 Deposits [accretions] on teeth: Secondary | ICD-10-CM

## 2014-09-04 DIAGNOSIS — Z01818 Encounter for other preprocedural examination: Secondary | ICD-10-CM | POA: Diagnosis not present

## 2014-09-04 DIAGNOSIS — K0530 Chronic periodontitis, unspecified: Secondary | ICD-10-CM

## 2014-09-04 DIAGNOSIS — IMO0002 Reserved for concepts with insufficient information to code with codable children: Secondary | ICD-10-CM

## 2014-09-04 MED ORDER — SODIUM FLUORIDE 1.1 % DT GEL: DENTAL | Status: DC

## 2014-09-04 NOTE — Progress Notes (Signed)
DENTAL CONSULTATION  Date of Consultation:  09/04/2014   Patient Name:   Maria Rogers Date of Birth:   Oct 13, 1945 Medical Record Number: [id number]  VITALS: BP 144/72 mmHg  Pulse 65  Temp(Src) 98.5 F (36.9 C) (Oral)  CHIEF COMPLAINT: The  patient referred by Dr. Isidore Moos for a dental consultation.   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 Patient underwent a superficial parotidectomy with Dr. Benjamine Mola on 07/20/2014. Patient with anticipated postoperative radiation therapy with Dr. Isidore Moos. Patient is now seen as part of a medically necessary preradiation therapy dental protocol examination.  The patient currently denies acute toothaches, swellings, or abscesses. Patient was last seen by her primary dentist, Dr. Jacqulynn Cadet in [place], [place], for an exam and cleaning and start of a crown on tooth #4. Tooth #4 currently has a temporary crown restoration with anticipated insertion of final restoration in three weeks. Patient did have some sensitivity to cold involving tooth #30 and was given some desensitizing medication by her primary Dentist to use for decreasing this sensitivity. Patient also is using Sensodyne toothpaste.  Patient indicates that she usually sees the dentist every 6 months. Patient has been seeing Dr. Garwin Brothers until he retired and Dr. Luan Pulling took over his practice.  PROBLEM LIST: Patient Active Problem List   Diagnosis Date Noted  . Cancer of parotid gland 07/20/2014    Priority: High  . H/O superficial parotidectomy 07/20/2014    PMH: Past Medical History  Diagnosis Date  . Hypothyroidism   . Depression   . Parotid mass 2016    PSH: Past Surgical History  Procedure Laterality Date  . Back surgery  2004    2 disc in neck  . Cholecystectomy  2008    lap chole  . Knee arthroscopy Right 2006  . Parotidectomy Right 07/20/2014    Procedure: PAROTIDECTOMY;   Surgeon: Leta Baptist, MD;  Location: [facility];  Service: ENT;  Laterality: Right;    ALLERGIES: No Known Allergies  MEDICATIONS: Current Outpatient Prescriptions  Medication Sig Dispense Refill  . levothyroxine (SYNTHROID, LEVOTHROID) 112 MCG tablet Take 112 mcg by mouth daily before breakfast.    . venlafaxine (EFFEXOR) 75 MG tablet Take 75 mg by mouth 2 (two) times daily with a meal.    . acetaminophen (TYLENOL) 325 MG tablet Take 650 mg by mouth at bedtime as needed.    Marland Kitchen oxyCODONE-acetaminophen (ROXICET) 5-325 MG per tablet Take 1-2 tablets by mouth every 4 (four) hours as needed for severe pain. (Patient not taking: Reported on 08/03/2014) 30 tablet 0   No current facility-administered medications for this visit.   LABS: Lab Results  Component Value Date   HGB 13.4 07/20/2014      Component Value Date/Time   CREATININE 0.90 05/18/2014 1027   No results found for: INR, PROTIME No results found for: PTT  SOCIAL HISTORY: History   Social History  . Marital Status: Married    Spouse Name: N/A  . Number of Children: 3  . Years of Education: N/A   Occupational History  . Not on file.   Social History Main Topics  . Smoking status: Never Smoker   . Smokeless tobacco: Never Used  . Alcohol Use: No  . Drug Use: No  . Sexual Activity: Not Currently   Other Topics Concern  . Not on file   Social History Narrative    The patient is married  with 3 sons. Patient is a retired Arts administrator.    Patient is currently taking care of her husband with a significant neurological disorder.    Patient is a nonsmoker and nondrinker. Patient has never used smokeless tobacco or other illicit drugs.     FAMILY HISTORY: Family History  Problem Relation Age of Onset  . Cancer Mother   . Heart attack Father   . Thyroid cancer Father   . Basal cell carcinoma Mother     REVIEW OF SYSTEMS: Reviewed with the patient and is included in dental  record.  DENTAL HISTORY: CHIEF COMPLAINT: The  patient referred by Dr. Isidore Moos for a dental consultation.   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 Patient underwent a superficial parotidectomy with Dr. Benjamine Mola on 07/20/2014. Patient with anticipated postoperative radiation therapy with Dr. Isidore Moos. Patient is now seen as part of a medically necessary preradiation therapy dental protocol examination.  The patient currently denies acute toothaches, swellings, or abscesses. Patient was last seen by her primary dentist, Dr. Jacqulynn Cadet in [place], [place], for an exam and cleaning and start of a crown on tooth #4. Tooth #4 currently has a temporary crown restoration with anticipated insertion of final restoration in three weeks. Patient did have some sensitivity to cold involving tooth #30 and was given some desensitizing medication by her primary Dentist to use for decreasing this sensitivity. Patient also is using Sensodyne toothpaste.  Patient indicates that she usually sees the dentist every 6 months. Patient has been seeing Dr. Garwin Brothers until he retired and Dr. Luan Pulling took over his practice.  DENTAL EXAMINATION: GENERAL: The patient is a well-developed, well-nourished female in no acute distress. HEAD AND NECK: There is no palpable lymphadenopathy. The patient denies acute TMJ symptoms. The right neck is consistent with previous parotid surgery. The left cheek is consistent with previous needle biopsy. INTRAORAL EXAM: Patient has normal saliva. Patient bilateral mandibular lingual tori. Patient has buccal exostoses in the area of tooth numbers 18 through 20 and 29-31. There is no evidence of oral abscess formation. DENTITION: Patient is missing tooth numbers 17. PERIODONTAL: Patient has chronic periodontitis with minimal plaque accumulations, selective areas gingival recession, and no significant tooth  mobility. DENTAL CARIES/SUBOPTIMAL RESTORATIONS: No obvious dental caries are noted. Patient has a temporary crown on tooth #4 with anticipated delivery of final crown within in three weeks. ENDODONTIC: Patient currently denies acute pulpitis symptoms. Patient has had previous root canal therapy associated with tooth numbers 19 and 20 19 with no persistent periapical pathology or radiolucency. Patient has had recent cold sensitivity on tooth #30 with no obvious periapical pathology. CROWN AND BRIDGE: Patient has multiple crown restorations noted that appear to be acceptable. Patient currently has a temporary crown on tooth #4 with anticipated delivery of final restoration in three weeks. PROSTHODONTIC: No need for partial dentures. OCCLUSION: Stable occlusion. Patient does have a history of previous orthodontic therapy.  RADIOGRAPHIC INTERPRETATION: Orthopantogram was taken and supplemented with a full series of dental radiographs. Patient has a missing tooth number 17. There is incipient to moderate bone loss noted. There are mandibular radiopacities consistent with bilateral mandibular lingual tori and exostoses. Multiple dental restorations are noted. Root canal therapies are associated with tooth numbers 19 and 20 with no obvious persistent periapical pathology or radiolucency.   ASSESSMENTS: 1. Metastatic squamous cell carcinoma to the right parotid gland 2. Status post right parotidectomy 3. Preradiation  therapy dental protocol 4. Missing tooth number 17 5. Chronic periodontitis of bone loss 6. Gingival recession 7. Accretions-minimal 8. Temporary restoration of tooth #4 with pending delivery of final restoration. 9. Stable occlusion 10. Bilateral mandibular lingual tori. 11. Bilateral mandibular buccal exostoses 12. Maxillary and mandibular anterior incisal attrition.  PLAN/RECOMMENDATIONS: 1. I discussed the risks, benefits, and complications of various treatment options with the  patient in relationship to her medical and dental conditions, anticipated radiation therapy, and radiation therapy side effects to include xerostomia, radiation caries, trismus, mucositis, taste changes, gum and jawbone changes, and risk for infection and osteoradionecrosis.  We discussed various treatment options to include no treatment, extraction of teeth in the primary field of radiation therapy, pre-prosthetic surgery as indicated, periodontal therapy, dental restorations, root canal therapy, crown and bridge therapy, implant therapy, and replacement of missing teeth as indicated. We also discussed fabrication of fluoride trays and scatter protection devices. The patient currently wishes to proceed with impressions today for the fabrication of fluoride trays and scatter guards. Prescription for FluoroSHIELD was sent to [facility] long outpatient pharmacy. Patient will return to clinic on 09/14/2014 for insertion of the fluoride trays and scatter guards prior to simulation with Dr. Isidore Moos on Tuesday, 09/15/2014.  The patient will then follow-up with her primary dentist, Dr. Luan Pulling, for insertion of the permanent restoration on #4 when available. Patient will also be maintained for routine periodontal therapy with Dr. Luan Pulling approximately 2-3 months after the last radiation therapy has been completed.   2. Discussion of findings with medical team and coordination of future medical and dental care as needed.  I spent in excess of  120 minutes during the conduct of this consultation and >50% of this time involved direct face-to-face encounter for counseling and/or coordination of the patient's care.    Lenn Cal, DDS

## 2014-09-04 NOTE — Patient Instructions (Signed)

## 2014-09-14 ENCOUNTER — Ambulatory Visit (HOSPITAL_COMMUNITY): Payer: Self-pay | Admitting: Dentistry

## 2014-09-14 ENCOUNTER — Encounter (HOSPITAL_COMMUNITY): Payer: Self-pay | Admitting: Dentistry

## 2014-09-14 VITALS — BP 133/77 | HR 62 | Temp 98.2°F

## 2014-09-14 DIAGNOSIS — C07 Malignant neoplasm of parotid gland: Secondary | ICD-10-CM | POA: Diagnosis not present

## 2014-09-14 DIAGNOSIS — Z463 Encounter for fitting and adjustment of dental prosthetic device: Secondary | ICD-10-CM

## 2014-09-14 DIAGNOSIS — Z01818 Encounter for other preprocedural examination: Secondary | ICD-10-CM | POA: Diagnosis not present

## 2014-09-14 DIAGNOSIS — Z9889 Other specified postprocedural states: Secondary | ICD-10-CM

## 2014-09-14 NOTE — Progress Notes (Signed)
09/14/2014  Patient Name:   Maria Rogers Date of Birth:   11-12-1945 Medical Record Number: [id number]  BP 133/77 mmHg  Pulse 62  Temp(Src) 98.2 F (36.8 C) (Oral)  Ivelis B Morson now presents for insertion of upper and lower fluoride trays and scatter protection devices. Patient has not received the crown from Dr. Luan Pulling at this time. Patient indicates that husband was recently taken to the hospital this morning for evaluation of elevated fever.  PROCEDURE: Appliances were tried in and adjusted as needed. [place] Trismus device was previously fabricated 47 mm using 28 sticks. Postop instructions were provided and a written and verbal format concerning the use and care of appliances. All questions were answered. Patient to call for appointment for periodic oral examination in approximately 2-3 weeks during radiation therapy if she desires this appointment otherwise call for appointment one month after radiation has been completed. Patient to call if questions or problems arise before then.  Lenn Cal, DDS

## 2014-09-14 NOTE — Patient Instructions (Signed)

## 2014-09-16 ENCOUNTER — Encounter (HOSPITAL_COMMUNITY): Payer: Medicare Other | Attending: Hematology & Oncology | Admitting: Hematology & Oncology

## 2014-09-16 ENCOUNTER — Encounter (HOSPITAL_COMMUNITY): Payer: Self-pay | Admitting: Hematology & Oncology

## 2014-09-16 VITALS — BP 129/82 | HR 64 | Temp 98.7°F | Resp 16 | Wt 169.8 lb

## 2014-09-16 DIAGNOSIS — C07 Malignant neoplasm of parotid gland: Secondary | ICD-10-CM | POA: Diagnosis not present

## 2014-09-16 NOTE — Progress Notes (Signed)
[facility] NOTE  Patient Care Team: Caryl Bis, MD as PCP - General (Family Medicine)  CHIEF COMPLAINTS/PURPOSE OF CONSULTATION:  Squamous Cell Carcinoma R Parotid Gland Negative for p16 Right lateral parotidectomy with facial nerve dissection on 07/20/2014  HISTORY OF PRESENTING ILLNESS:  Maria Rogers 69 y.o. 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 It is uncertain if this was a primary versus metastatic disease. Prior to surgical resection on 6/6 she underwent an FNA that was benign. The patient also notes prior to seeing Dr. Benjamine Mola she was treated with 2 courses of Antibiotics with no improvements in the size of the mass. She was presented at head and neck tumor Board in [place] where evaluation for other sites of disease was recommended. PET CT was performed on 07/30/2014 with no evidence of nodal metastases within the neck, no evidence of distant metastases. There was evidence of post excisional biopsy inflammation, and a round lesion was noted in the left parotid gland that had no metabolic activity and was considered to be benign. Additional workup was recommended. She follows currently with Dr. Benjamine Mola.   The patient will be given teeth guards and she had her mask made yesterday. She will begin XRT in [place] with Dr. Isidore Moos.  She has had an ultrasound and biopsy on the left side of her neck, referred by Dr. Isidore Moos, that had negative results. She is relieved.  Her dermatologist Dr. Denna Haggard however, found a small squamous cell carcinoma on the left side of her face. She has not had this area removed yet.  MEDICAL HISTORY:  Past Medical History  Diagnosis Date  . Hypothyroidism   . Depression   . Parotid mass 2016    SURGICAL HISTORY: Past Surgical History  Procedure Laterality Date  . Back surgery  2004    2 disc in neck  . Cholecystectomy  2008    lap chole  . Knee arthroscopy Right  2006  . Parotidectomy Right 07/20/2014    Procedure: PAROTIDECTOMY;  Surgeon: Leta Baptist, MD;  Location: [facility];  Service: ENT;  Laterality: Right;    SOCIAL HISTORY: History   Social History  . Marital Status: Married    Spouse Name: N/A  . Number of Children: 3  . Years of Education: N/A   Occupational History  . Not on file.   Social History Main Topics  . Smoking status: Never Smoker   . Smokeless tobacco: Never Used  . Alcohol Use: No  . Drug Use: No  . Sexual Activity: Not Currently   Other Topics Concern  . Not on file   Social History Narrative    The patient is married with 3 sons. Patient is a retired Arts administrator.    Patient is currently taking care of her husband with a significant neurological disorder.    Patient is a nonsmoker and nondrinker. Patient has never used smokeless tobacco or other illicit drugs.  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 Cancer Mother   . Heart attack Father   . Thyroid cancer Father   . Basal cell carcinoma Mother    indicated that her mother is deceased. She indicated that her father is deceased. She indicated that her sister is deceased.  She indicated that her brother is alive. She indicated that her maternal grandmother is deceased. She indicated that her maternal grandfather is deceased. She indicated that her paternal grandmother is deceased. She indicated that her paternal grandfather is deceased.  Mother deceased, 20, colon cancer, Basal Cell Carinoma Father deceaed, 13, heart attack and thyroid cancer Sister deceased 53, pulmonary edema, ETOH problems Brother living 3 children, 6 grandchildren   ALLERGIES:  has No Known Allergies.  MEDICATIONS:  Current Outpatient Prescriptions  Medication Sig Dispense Refill   . acetaminophen (TYLENOL) 325 MG tablet Take 650 mg by mouth at bedtime as needed.    Marland Kitchen levothyroxine (SYNTHROID, LEVOTHROID) 112 MCG tablet Take 112 mcg by mouth daily before breakfast.    . sodium fluoride (FLUORISHIELD) 1.1 % GEL dental gel Instill one drop of gel per toothspace of fluoride tray. Place over teeth for 5 minutes. Remove. Spit out excess. Repeat nightly. 120 mL prn  . venlafaxine XR (EFFEXOR-XR) 75 MG 24 hr capsule Take 75 mg by mouth 2 (two) times daily.    Marland Kitchen oxyCODONE-acetaminophen (ROXICET) 5-325 MG per tablet Take 1-2 tablets by mouth every 4 (four) hours as needed for severe pain. (Patient not taking: Reported on 09/16/2014) 30 tablet 0   No current facility-administered medications for this visit.    Review of Systems  Constitutional: Positive for weight loss. Negative for fever, chills, malaise/fatigue and diaphoresis.       Due to change in Thyroid medication  HENT: Negative.   Eyes: Negative.   Respiratory: Negative.   Cardiovascular: Negative.   Gastrointestinal: Negative.   Genitourinary: Negative.   Musculoskeletal: Negative.   Skin: Negative.   Neurological: Negative.  Negative for weakness.  Endo/Heme/Allergies: Negative.   Psychiatric/Behavioral: Negative.   All other systems reviewed and are negative.  14 point ROS was done and is otherwise as detailed above or in HPI   PHYSICAL EXAMINATION: ECOG PERFORMANCE STATUS: 0 - Asymptomatic  Filed Vitals:   09/16/14 1200  BP: 129/82  Pulse: 64  Temp: 98.7 F (37.1 C)  Resp: 16   Filed Weights   09/16/14 1200  Weight: 169 lb 12.8 oz (77.021 kg)    Physical Exam  Constitutional: She is oriented to person, place, and time and well-developed, well-nourished, and in no distress.  HENT:  Head: Normocephalic and atraumatic.  Mouth/Throat: Oropharynx is clear and moist.  Eyes: Conjunctivae and EOM are normal. Pupils are equal, round, and reactive to light.  Neck: Normal range of motion. Neck supple.    Right incision posterior ear down into upper neck, feeling is numb  Cardiovascular: Normal rate, regular rhythm, normal heart sounds and intact distal pulses.   Pulmonary/Chest: Effort normal and breath sounds normal.  Abdominal: Soft. Bowel sounds are normal.  Musculoskeletal: Normal range of motion.  Lymphadenopathy:       Head (right side): No submental, no submandibular, no preauricular and no posterior auricular adenopathy present.       Head (left side): No submental, no submandibular, no preauricular and no posterior auricular adenopathy present.    She has no cervical adenopathy.    She has no axillary adenopathy.       Right: No supraclavicular and no epitrochlear adenopathy present.       Left: No supraclavicular and no epitrochlear adenopathy present.  Neurological: She is alert and oriented to person, place, and time.  Skin: Skin is warm and dry.  Skin of scalp was examined without evidence of any skin lesions/abnormalities  Psychiatric: Mood,  memory, affect and judgment normal.  Nursing note and vitals reviewed.   LABORATORY DATA:  I have reviewed the data as listed Lab Results  Component Value Date   HGB 13.4 07/20/2014   RADIOGRAPHIC STUDIES: I have personally reviewed the radiological images as listed and agreed with the findings in the report.  CLINICAL DATA: Right neck mass.  EXAM: CT NECK WITH CONTRAST  TECHNIQUE: Multidetector CT imaging of the neck was performed using the standard protocol following the bolus administration of intravenous contrast.  CONTRAST: 50mL OMNIPAQUE IOHEXOL 300 MG/ML SOLN  COMPARISON: None.  FINDINGS: Pharynx and larynx: No focal mucosal or submucosal lesions are present. The epiglottis is normal. The valleculae is clear. The vocal cords are midline and symmetric.  Salivary glands: A peripherally enhancing low-density lesion is present at the tail of the right parotid gland measuring 2.0 x 2.7 x 3.1 cm. There  is no clear fat plane between this lesion in the parotid gland. No other discrete parotid lesions are evident. The submandibular glands are within normal limits bilaterally.  Thyroid: Negative  Lymph nodes: Multiple borderline posterior right level 2 lymph nodes are present. A single borderline posterior left level 2 node measures 11 mm in long axis. No significant lower adenopathy is present.  Vascular: Mild atherosclerotic calcifications are present at the carotid bifurcations bilaterally without significant stenoses.  Limited intracranial: Negative.  Visualized orbits: Within normal limits.  Mastoids and visualized paranasal sinuses: Clear.  Skeleton: Degenerative endplate changes are present at C5-6 and C6-7 with uncovertebral spurring bilaterally. No focal lytic or blastic lesions are present.  Upper chest: Interlobular and pleural thickening is present at the lung apices bilaterally, right greater than left. Mild emphysematous changes are noted.  IMPRESSION: 1. 3.1 cm peripherally enhancing low-density lesion at the tail the right parotid gland. This could represent a primary parotid lesion such is benign mixed tumor or Warthin's tumor. There is no evidence for malignancy aggressive features. This may represent a necrotic lymph node. No other primary lesion is evident. The lesion is somewhat high for and a second branchial cleft cyst. An infected branchial cleft cyst is considered least likely. 2. Other borderline the posterior level 2 nodes are present bilaterally, suggesting an inflammatory process. 3. Mild degenerative changes of the cervical spine at C5-6 and C6-7.   Electronically Signed  By: San Morelle M.D.  On: 05/18/2014 11:07   CLINICAL DATA: Initial treatment strategy for parotid gland neoplasm. Squamous cell carcinoma the right parotid gland  EXAM: NUCLEAR MEDICINE PET SKULL BASE TO THIGH  TECHNIQUE: 8.4 mCi F-18 FDG was  injected intravenously. Full-ring PET imaging was performed from the skull base to thigh after the radiotracer. CT data was obtained and used for attenuation correction and anatomic localization.  FASTING BLOOD GLUCOSE: Value: 97 mg/dl  COMPARISON: Neck CT 05/18/2014  FINDINGS: NECK  There is diffuse uptake within the right parotid gland likely related to excisional biopsy. No hypermetabolic nodes in the left or right neck.  15 mm rounded lesion the inferior/tail of the left parotid gland on image 25, series 4. This does not have metabolic activity above normal probable activity.  There is hypermetabolic activity associated with the right scalene muscles which is felt to relate to normal muscle activity.  CHEST  No hypermetabolic mediastinal or hilar nodes. No suspicious pulmonary nodules on the CT scan.  ABDOMEN/PELVIS  No abnormal hypermetabolic activity within the liver, pancreas, adrenal glands, or spleen. No hypermetabolic lymph nodes in the abdomen or pelvis.  SKELETON  No focal hypermetabolic activity to suggest skeletal metastasis.  IMPRESSION: 1. No evidence of nodal metastasis within the neck. 2. No evidence of distant metastasis on the whole-body scan. 3. Uptake in the right parotid gland consistent with post excisional biopsy inflammation. 4. Round lesion within the left parotid gland does not have metabolic activity above background which is suggestive of benign etiology. Consider ultrasound and potential biopsy to further characterize this left parotid lesion.   Electronically Signed  By: Suzy Bouchard M.D.  On: 07/30/2014 10:27   PATHOLOGY:  Diagnosis Parotid gland, right - SQUAMOUS CELL CARCINOMA - SEE COMMENT Microscopic Comment The specimen reveals squamous cell carcinoma with cystic change. There is abundant lymphoid tissue in the background with reactive germinal centers, as highlighted by stains for BCL2, BCL6,  CD5, CD10, CD20, and CD34 stains. Stains for p16 (HPV marker) and CD68 are negative. The squamous cells are highlighted by stains for p63, cytokeratin 5/6, cytokeratin 7, and cytokeratin 903. The abundant lymphoid material suggests that this lesion may represent metastatic squamous cell carcinoma involving a lymph node. Dr. Gari Crown has reviewed selected slides and concurs with this interpretation. The case was discussed with Dr. Benjamine Mola on 07/22/2014. Enid Cutter MD Pathologist, Electronic Signature (Case signed 07/22/2014)   ASSESSMENT & PLAN:  [id number] squamous cell carcinoma parotid gland/lymph node PET/CT negative for evidence of other disease History of basal cell carcinoma of the skin Biopsy of L parotid, benign ([facility], path not available)   69 year old female diagnosed with squamous cell carcinoma of the right parotid gland. Pathology suggests metastatic squamous cell carcinoma involving a lymph node. Primary site has not been found based on PET CT imaging. She did undergo endoscopy with Dr. Benjamine Mola  She is prepared to begin radiation therapy with Dr. Isidore Moos. I recommended follow-up with [place] in 2 months. We need to get Dr. Pearlie Oyster last notes and in addition obtain the pathology report from biopsy of the lesion in the left parotid.  All questions were answered. The patient knows to call the clinic with any problems, questions or concerns.   This note was electronically signed.   This document serves as a record of services personally performed by Ancil Linsey, MD. It was created on her behalf by Janace Hoard, a trained medical scribe. The creation of this record is based on the scribe's personal observations and the provider's statements to them. This document has been checked and approved by the attending provider.  I have reviewed the above documentation for accuracy and completeness, and I agree with the above.  Kelby Fam. Whitney Muse, MD

## 2014-09-16 NOTE — Patient Instructions (Signed)
..  [place] at [facility] Discharge Instructions  RECOMMENDATIONS MADE BY THE CONSULTANT AND ANY TEST RESULTS WILL BE SENT TO YOUR REFERRING PHYSICIAN.  Seen and discussion with Dr. Whitney Muse. Call the cancer center with any questions and/or concerns that you have.  Follow up appt in 2 months.    Thank you for choosing [place] at [facility] to provide your oncology and hematology care.  To afford each patient quality time with our provider, please arrive at least 15 minutes before your scheduled appointment time.    You need to re-schedule your appointment should you arrive 10 or more minutes late.  We strive to give you quality time with our providers, and arriving late affects you and other patients whose appointments are after yours.  Also, if you no show three or more times for appointments you may be dismissed from the clinic at the providers discretion.     Again, thank you for choosing [facility].  Our hope is that these requests will decrease the amount of time that you wait before being seen by our physicians.       _____________________________________________________________  Should you have questions after your visit to [facility], please contact our office at [phone] between the hours of 8:30 a.m. and 4:30 p.m.  Voicemails left after 4:30 p.m. will not be returned until the following business day.  For prescription refill requests, have your pharmacy contact our office.

## 2014-09-25 NOTE — Progress Notes (Signed)
This encounter was created in error - please disregard.

## 2014-10-24 ENCOUNTER — Encounter (HOSPITAL_COMMUNITY): Payer: Self-pay | Admitting: Hematology & Oncology

## 2014-11-18 ENCOUNTER — Ambulatory Visit (HOSPITAL_COMMUNITY): Payer: Self-pay | Admitting: Hematology & Oncology

## 2014-11-25 ENCOUNTER — Ambulatory Visit (HOSPITAL_COMMUNITY): Payer: Self-pay | Admitting: Hematology & Oncology

## 2014-11-27 NOTE — Progress Notes (Signed)
This encounter was created in error - please disregard.

## 2014-12-07 ENCOUNTER — Ambulatory Visit (HOSPITAL_COMMUNITY): Payer: Self-pay | Admitting: Dentistry

## 2014-12-10 ENCOUNTER — Encounter (HOSPITAL_COMMUNITY): Payer: Self-pay

## 2014-12-14 ENCOUNTER — Telehealth (HOSPITAL_COMMUNITY): Payer: Self-pay | Admitting: Dentistry

## 2014-12-14 ENCOUNTER — Ambulatory Visit (HOSPITAL_COMMUNITY): Payer: Self-pay | Admitting: Dentistry

## 2014-12-14 NOTE — Telephone Encounter (Signed)
12/14/2014  Patient:            Maria Rogers Date of Birth:  22-May-1945 MRN:                [id number]   Broken appt. for followup examination on 12/14/14 @ 9:15 am.   I called the patient on her cell phone and she was attempting to make appointment but got lost. Patient indicated that she would be unable to keep her appointment. Patient will call back to reschedule appt. LI/Dr. Enrique Sack

## 2014-12-18 ENCOUNTER — Encounter (HOSPITAL_COMMUNITY): Payer: Self-pay | Admitting: Dentistry

## 2014-12-18 ENCOUNTER — Ambulatory Visit (HOSPITAL_COMMUNITY): Payer: Self-pay | Admitting: Dentistry

## 2014-12-18 ENCOUNTER — Encounter (INDEPENDENT_AMBULATORY_CARE_PROVIDER_SITE_OTHER): Payer: Self-pay

## 2014-12-18 VITALS — BP 123/66 | HR 72 | Temp 97.5°F | Wt 135.0 lb

## 2014-12-18 DIAGNOSIS — Z0189 Encounter for other specified special examinations: Secondary | ICD-10-CM

## 2014-12-18 DIAGNOSIS — K117 Disturbances of salivary secretion: Secondary | ICD-10-CM

## 2014-12-18 DIAGNOSIS — C07 Malignant neoplasm of parotid gland: Secondary | ICD-10-CM | POA: Diagnosis not present

## 2014-12-18 DIAGNOSIS — R432 Parageusia: Secondary | ICD-10-CM

## 2014-12-18 DIAGNOSIS — Z9889 Other specified postprocedural states: Secondary | ICD-10-CM

## 2014-12-18 DIAGNOSIS — Z5189 Encounter for other specified aftercare: Secondary | ICD-10-CM | POA: Diagnosis not present

## 2014-12-18 DIAGNOSIS — R682 Dry mouth, unspecified: Secondary | ICD-10-CM

## 2014-12-18 NOTE — Progress Notes (Signed)
12/18/2014  Patient Name:   Maria Rogers Date of Birth:   [birth year] Medical Record Number: [id number]  BP 123/66 mmHg  Pulse 72  Temp(Src) 97.5 F (36.4 C) (Oral)  Wt 135 lb (61.236 kg)  Asma B Lisenby presents for oral examination after radiation therapy. Patient completed radiation treatments from 09/24/14 thru 11/06/14.  REVIEW OF CHIEF COMPLAINTS:  DRY MOUTH: Yes HARD TO SWALLOW: No  HURT TO SWALLOW: No TASTE CHANGES: Taste is returning. Patient has sweet and salty tastes that have returned. SORES IN MOUTH: Denies TRISMUS: No problems with trismus symptoms. WEIGHT: 135 pounds. Approximately 30 pound weight loss during treatment.  HOME OH REGIMEN:  BRUSHING: Twice a day FLOSSING: At bedtime RINSING: Using salt water and baking soda rinses and Biotene rinses. FLUORIDE: Using fluoride in trays at bedtime. TRISMUS EXERCISES:  Maximum interincisal opening: 50 mm.  DENTAL EXAM:  Oral Hygiene:(PLAQUE): Good oral hygiene. LOCATION OF MUCOSITIS: None noted DESCRIPTION OF SALIVA: Xerostomia. Foamy saliva. ANY EXPOSED BONE: None noted OTHER WATCHED AREAS: Mandibular tori and lateral exostoses. Monitor for trauma. DX: Xerostomia and Dysgeusia  RECOMMENDATIONS: 1. Brush after meals and at bedtime.  Use fluoride at bedtime. 2. Use trismus exercises as directed. 3. Use Biotene Rinse or salt water/baking soda rinses. 4. Multiple sips of water as needed. 5. Return to primary dentist for periodontal maintenance procedures in January 2017 as scheduled.  Lenn Cal, DDS

## 2014-12-18 NOTE — Patient Instructions (Signed)
RECOMMENDATIONS: 1. Brush after meals and at bedtime.  Use fluoride at bedtime. 2. Use trismus exercises as directed. 3. Use Biotene Rinse or salt water/baking soda rinses. 4. Multiple sips of water as needed. 5. Return to primary dentist for periodontal maintenance procedures in January 2017 as scheduled.  Lenn Cal, DDS

## 2015-01-14 ENCOUNTER — Other Ambulatory Visit: Payer: Self-pay | Admitting: Dermatology

## 2015-01-28 ENCOUNTER — Ambulatory Visit: Payer: Self-pay | Admitting: Orthopedic Surgery

## 2015-01-28 NOTE — Progress Notes (Signed)
Preoperative surgical orders have been place into the Epic hospital system for [place] on 01/28/2015, 10:28 AM  by Mickel Crow for surgery on 02-24-15.  Preop Total Knee orders including Experal, IV Tylenol, and IV Decadron as long as there are no contraindications to the above medications. Arlee Muslim, PA-C

## 2015-02-03 ENCOUNTER — Encounter (HOSPITAL_COMMUNITY): Payer: Medicare Other | Attending: Hematology & Oncology | Admitting: Hematology & Oncology

## 2015-02-03 ENCOUNTER — Encounter (HOSPITAL_COMMUNITY): Payer: Self-pay | Admitting: Hematology & Oncology

## 2015-02-03 VITALS — BP 125/66 | HR 85 | Temp 98.2°F | Resp 18 | Wt 130.2 lb

## 2015-02-03 DIAGNOSIS — C07 Malignant neoplasm of parotid gland: Secondary | ICD-10-CM

## 2015-02-03 NOTE — Patient Instructions (Addendum)
[place] at [facility] Discharge Instructions  RECOMMENDATIONS MADE BY THE CONSULTANT AND ANY TEST RESULTS WILL BE SENT TO YOUR REFERRING PHYSICIAN.   Exam completed by Dr Whitney Muse today Return to see the doctor in 4 months Mammogram scheduled for January  PET scan before you come back in 4 months Please call the clinic if you have any questions or concerns    Thank you for choosing [place] at [facility] to provide your oncology and hematology care.  To afford each patient quality time with our provider, please arrive at least 15 minutes before your scheduled appointment time.    You need to re-schedule your appointment should you arrive 10 or more minutes late.  We strive to give you quality time with our providers, and arriving late affects you and other patients whose appointments are after yours.  Also, if you no show three or more times for appointments you may be dismissed from the clinic at the providers discretion.     Again, thank you for choosing [facility].  Our hope is that these requests will decrease the amount of time that you wait before being seen by our physicians.       _____________________________________________________________  Should you have questions after your visit to [facility], please contact our office at [phone] between the hours of 8:30 a.m. and 4:30 p.m.  Voicemails left after 4:30 p.m. will not be returned until the following business day.  For prescription refill requests, have your pharmacy contact our office.
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
## 2015-02-16 NOTE — Patient Instructions (Addendum)
YOUR PROCEDURE IS SCHEDULED ON :  02/23/14  REPORT TO [facility] MAIN ENTRANCE FOLLOW SIGNS TO EAST ELEVATOR - GO TO 3rd FLOOR CHECK IN AT 3 EAST NURSES STATION (SHORT STAY) AT:  10:30 AM  CALL THIS NUMBER IF YOU HAVE PROBLEMS THE MORNING OF SURGERY [phone]  REMEMBER:ONLY 1 PER PERSON MAY GO TO SHORT STAY WITH YOU TO GET READY THE MORNING OF YOUR SURGERY  DO NOT EAT FOOD OR DRINK LIQUIDS AFTER MIDNIGHT  TAKE THESE MEDICINES THE MORNING OF SURGERY: LEVOTHYROXINE / EFFEXOR / FLONASE NASAL SPRAY  YOU MAY NOT HAVE ANY METAL ON YOUR BODY INCLUDING HAIR PINS AND PIERCING'S. DO NOT WEAR JEWELRY, MAKEUP, LOTIONS, POWDERS OR PERFUMES. DO NOT WEAR NAIL POLISH. DO NOT SHAVE 48 HRS PRIOR TO SURGERY. MEN MAY SHAVE FACE AND NECK.  DO NOT [place]. [facility] IS NOT RESPONSIBLE FOR VALUABLES.  CONTACTS, DENTURES OR PARTIALS MAY NOT BE WORN TO SURGERY. LEAVE SUITCASE IN CAR. CAN BE BROUGHT TO ROOM AFTER SURGERY.  PATIENTS DISCHARGED THE DAY OF SURGERY WILL NOT BE ALLOWED TO DRIVE HOME.  PLEASE READ OVER THE FOLLOWING INSTRUCTION SHEETS _________________________________________________________________________________                                          [facility] - PREPARING FOR SURGERY  Before surgery, you can play an important role.  Because skin is not sterile, your skin needs to be as free of germs as possible.  You can reduce the number of germs on your skin by washing with CHG (chlorahexidine gluconate) soap before surgery.  CHG is an antiseptic cleaner which kills germs and bonds with the skin to continue killing germs even after washing. Please DO NOT use if you have an allergy to CHG or antibacterial soaps.  If your skin becomes reddened/irritated stop using the CHG and inform your nurse when you arrive at Short Stay. Do not shave (including legs and underarms) for at least 48 hours prior to the first CHG shower.  You may shave your  face. Please follow these instructions carefully:   1.  Shower with CHG Soap the night before surgery and the  morning of Surgery.   2.  If you choose to wash your hair, wash your hair first as usual with your  normal  Shampoo.   3.  After you shampoo, rinse your hair and body thoroughly to remove the  shampoo.                                         4.  Use CHG as you would any other liquid soap.  You can apply chg directly  to the skin and wash . Gently wash with scrungie or clean wascloth    5.  Apply the CHG Soap to your body ONLY FROM THE NECK DOWN.   Do not use on open                           Wound or open sores. Avoid contact with eyes, ears mouth and genitals (private parts).                        Genitals (private parts)  with your normal soap.              6.  Wash thoroughly, paying special attention to the area where your surgery  will be performed.   7.  Thoroughly rinse your body with warm water from the neck down.   8.  DO NOT shower/wash with your normal soap after using and rinsing off  the CHG Soap .                9.  Pat yourself dry with a clean towel.             10.  Wear clean night clothes to bed after shower             11.  Place clean sheets on your bed the night of your first shower and do not  sleep with pets.  Day of Surgery : Do not apply any lotions/deodorants the morning of surgery.  Please wear clean clothes to the hospital/surgery center.  FAILURE TO FOLLOW THESE INSTRUCTIONS MAY RESULT IN THE CANCELLATION OF YOUR SURGERY    PATIENT SIGNATURE_________________________________  ______________________________________________________________________     Adam Phenix  An incentive spirometer is a tool that can help keep your lungs clear and active. This tool measures how well you are filling your lungs with each breath. Taking long deep breaths may help reverse or decrease the chance of developing breathing (pulmonary) problems  (especially infection) following:  A long period of time when you are unable to move or be active. BEFORE THE PROCEDURE   If the spirometer includes an indicator to show your best effort, your nurse or respiratory therapist will set it to a desired goal.  If possible, sit up straight or lean slightly forward. Try not to slouch.  Hold the incentive spirometer in an upright position. INSTRUCTIONS FOR USE   Sit on the edge of your bed if possible, or sit up as far as you can in bed or on a chair.  Hold the incentive spirometer in an upright position.  Breathe out normally.  Place the mouthpiece in your mouth and seal your lips tightly around it.  Breathe in slowly and as deeply as possible, raising the piston or the ball toward the top of the column.  Hold your breath for 3-5 seconds or for as long as possible. Allow the piston or ball to fall to the bottom of the column.  Remove the mouthpiece from your mouth and breathe out normally.  Rest for a few seconds and repeat Steps 1 through 7 at least 10 times every 1-2 hours when you are awake. Take your time and take a few normal breaths between deep breaths.  The spirometer may include an indicator to show your best effort. Use the indicator as a goal to work toward during each repetition.  After each set of 10 deep breaths, practice coughing to be sure your lungs are clear. If you have an incision (the cut made at the time of surgery), support your incision when coughing by placing a pillow or rolled up towels firmly against it. Once you are able to get out of bed, walk around indoors and cough well. You may stop using the incentive spirometer when instructed by your caregiver.  RISKS AND COMPLICATIONS  Take your time so you do not get dizzy or light-headed.  If you are in pain, you may need to take or ask for pain medication before doing incentive spirometry. It is harder to take  a deep breath if you are having pain. AFTER  USE  Rest and breathe slowly and easily.  It can be helpful to keep track of a log of your progress. Your caregiver can provide you with a simple table to help with this. If you are using the spirometer at home, follow these instructions: [place] IF:   You are having difficultly using the spirometer.  You have trouble using the spirometer as often as instructed.  Your pain medication is not giving enough relief while using the spirometer.  You develop fever of 100.5 F (38.1 C) or higher. SEEK IMMEDIATE MEDICAL CARE IF:   You cough up bloody sputum that had not been present before.  You develop fever of 102 F (38.9 C) or greater.  You develop worsening pain at or near the incision site. MAKE SURE YOU:   Understand these instructions.  Will watch your condition.  Will get help right away if you are not doing well or get worse. Document Released: 06/12/2006 Document Revised: 04/24/2011 Document Reviewed: 08/13/2006 ExitCare Patient Information 2014 ExitCare, [place].   ________________________________________________________________________  WHAT IS A BLOOD TRANSFUSION? Blood Transfusion Information  A transfusion is the replacement of blood or some of its parts. Blood is made up of multiple cells which provide different functions.  Red blood cells carry oxygen and are used for blood loss replacement.  White blood cells fight against infection.  Platelets control bleeding.  Plasma helps clot blood.  Other blood products are available for specialized needs, such as hemophilia or other clotting disorders. BEFORE THE TRANSFUSION  Who gives blood for transfusions?   Healthy volunteers who are fully evaluated to make sure their blood is safe. This is blood bank blood. Transfusion therapy is the safest it has ever been in the practice of medicine. Before blood is taken from a donor, a complete history is taken to make sure that person has no history of diseases  nor engages in risky social behavior (examples are intravenous drug use or sexual activity with multiple partners). The donor's travel history is screened to minimize risk of transmitting infections, such as malaria. The donated blood is tested for signs of infectious diseases, such as HIV and hepatitis. The blood is then tested to be sure it is compatible with you in order to minimize the chance of a transfusion reaction. If you or a relative donates blood, this is often done in anticipation of surgery and is not appropriate for emergency situations. It takes many days to process the donated blood. RISKS AND COMPLICATIONS Although transfusion therapy is very safe and saves many lives, the main dangers of transfusion include:   Getting an infectious disease.  Developing a transfusion reaction. This is an allergic reaction to something in the blood you were given. Every precaution is taken to prevent this. The decision to have a blood transfusion has been considered carefully by your caregiver before blood is given. Blood is not given unless the benefits outweigh the risks. AFTER THE TRANSFUSION  Right after receiving a blood transfusion, you will usually feel much better and more energetic. This is especially true if your red blood cells have gotten low (anemic). The transfusion raises the level of the red blood cells which carry oxygen, and this usually causes an energy increase.  The nurse administering the transfusion will monitor you carefully for complications. HOME CARE INSTRUCTIONS  No special instructions are needed after a transfusion. You may find your energy is better. Speak with your caregiver  about any limitations on activity for underlying diseases you may have. SEEK MEDICAL CARE IF:   Your condition is not improving after your transfusion.  You develop redness or irritation at the intravenous (IV) site. SEEK IMMEDIATE MEDICAL CARE IF:  Any of the following symptoms occur over the  next 12 hours:  Shaking chills.  You have a temperature by mouth above 102 F (38.9 C), not controlled by medicine.  Chest, back, or muscle pain.  People around you feel you are not acting correctly or are confused.  Shortness of breath or difficulty breathing.  Dizziness and fainting.  You get a rash or develop hives.  You have a decrease in urine output.  Your urine turns a dark color or changes to pink, red, or brown. Any of the following symptoms occur over the next 10 days:  You have a temperature by mouth above 102 F (38.9 C), not controlled by medicine.  Shortness of breath.  Weakness after normal activity.  The white part of the eye turns yellow (jaundice).  You have a decrease in the amount of urine or are urinating less often.  Your urine turns a dark color or changes to pink, red, or brown. Document Released: 01/28/2000 Document Revised: 04/24/2011 Document Reviewed: 09/16/2007 [facility] Patient Information 2014 [place], [place].  _______________________________________________________________________

## 2015-02-17 ENCOUNTER — Ambulatory Visit: Payer: Self-pay | Admitting: Orthopedic Surgery
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
## 2015-02-18 ENCOUNTER — Encounter (HOSPITAL_COMMUNITY): Payer: Self-pay

## 2015-02-18 ENCOUNTER — Encounter (HOSPITAL_COMMUNITY)
Admission: RE | Admit: 2015-02-18 | Discharge: 2015-02-18 | Disposition: A | Discharge status: Home / Self Care | Payer: Medicare Other | Source: Ambulatory Visit | Attending: Orthopedic Surgery | Admitting: Orthopedic Surgery

## 2015-02-18 ENCOUNTER — Encounter (INDEPENDENT_AMBULATORY_CARE_PROVIDER_SITE_OTHER): Payer: Self-pay

## 2015-02-18 DIAGNOSIS — M1711 Unilateral primary osteoarthritis, right knee: Secondary | ICD-10-CM | POA: Insufficient documentation

## 2015-02-18 DIAGNOSIS — Z01818 Encounter for other preprocedural examination: Secondary | ICD-10-CM | POA: Insufficient documentation

## 2015-02-18 HISTORY — DX: Unspecified osteoarthritis, unspecified site: M19.90

## 2015-02-18 HISTORY — DX: Personal history of other malignant neoplasm of skin: Z85.828

## 2015-02-18 HISTORY — DX: Malignant (primary) neoplasm, unspecified: C80.1

## 2015-02-18 LAB — COMPREHENSIVE METABOLIC PANEL
ALBUMIN: 4.2 g/dL (ref 3.5–5.0)
ALK PHOS: 75 U/L (ref 38–126)
ALT: 14 U/L (ref 14–54)
ANION GAP: 7 (ref 5–15)
AST: 19 U/L (ref 15–41)
BILIRUBIN TOTAL: 0.3 mg/dL (ref 0.3–1.2)
BUN: 17 mg/dL (ref 6–20)
CALCIUM: 9.7 mg/dL (ref 8.9–10.3)
CO2: 30 mmol/L (ref 22–32)
Chloride: 104 mmol/L (ref 101–111)
Creatinine, Ser: 0.83 mg/dL (ref 0.44–1.00)
GFR calc non Af Amer: >60 mL/min (ref >60)
GLUCOSE: 99 mg/dL (ref 65–99)
POTASSIUM: 4.5 mmol/L (ref 3.5–5.1)
SODIUM: 141 mmol/L (ref 135–145)
TOTAL PROTEIN: 7 g/dL (ref 6.5–8.1)

## 2015-02-18 LAB — URINALYSIS, ROUTINE W REFLEX MICROSCOPIC
GLUCOSE, UA: NEGATIVE mg/dL
HGB URINE DIPSTICK: NEGATIVE
Ketones, ur: NEGATIVE mg/dL
Leukocytes, UA: NEGATIVE
Nitrite: NEGATIVE
PH: 5.5 (ref 5.0–8.0)
Protein, ur: 30 mg/dL — AB
SPECIFIC GRAVITY, URINE: 1.034 — AB (ref 1.005–1.030)

## 2015-02-18 LAB — APTT: APTT: 28 s (ref 24–37)

## 2015-02-18 LAB — URINE MICROSCOPIC-ADD ON

## 2015-02-18 LAB — CBC
HEMATOCRIT: 34.8 % — AB (ref 36.0–46.0)
HEMOGLOBIN: 11.3 g/dL — AB (ref 12.0–15.0)
MCH: 30.5 pg (ref 26.0–34.0)
MCHC: 32.5 g/dL (ref 30.0–36.0)
MCV: 94.1 fL (ref 78.0–100.0)
Platelets: 299 10*3/uL (ref 150–400)
RBC: 3.7 MIL/uL — ABNORMAL LOW (ref 3.87–5.11)
RDW: 13.9 % (ref 11.5–15.5)
WBC: 3.1 10*3/uL — ABNORMAL LOW (ref 4.0–10.5)

## 2015-02-18 LAB — ABO/RH: ABO/RH(D): O NEG

## 2015-02-18 LAB — PROTIME-INR
INR: 1 (ref 0.00–1.49)
Prothrombin Time: 13.4 seconds (ref 11.6–15.2)

## 2015-02-18 LAB — SURGICAL PCR SCREEN
MRSA, PCR: NEGATIVE
Staphylococcus aureus: NEGATIVE

## 2015-02-19 NOTE — Progress Notes (Signed)
Abnormal UA faxed to Dr.Aluisio 

## 2015-02-23 NOTE — Progress Notes (Signed)
Received fax from [place] - no action needed on UA

## 2015-02-24 ENCOUNTER — Inpatient Hospital Stay (HOSPITAL_COMMUNITY): Payer: Medicare Other | Admitting: Anesthesiology

## 2015-02-24 ENCOUNTER — Inpatient Hospital Stay (HOSPITAL_COMMUNITY)
Admission: RE | Admit: 2015-02-24 | Discharge: 2015-02-26 | DRG: 470 | Disposition: A | Discharge status: Home Health | Payer: Medicare Other | Source: Ambulatory Visit | Attending: Orthopedic Surgery | Admitting: Orthopedic Surgery

## 2015-02-24 ENCOUNTER — Encounter (HOSPITAL_COMMUNITY): Payer: Self-pay | Admitting: *Deleted

## 2015-02-24 ENCOUNTER — Encounter (HOSPITAL_COMMUNITY)
Admission: RE | Disposition: A | Discharge status: Home Health | Payer: Self-pay | Source: Ambulatory Visit | Attending: Orthopedic Surgery

## 2015-02-24 DIAGNOSIS — M25561 Pain in right knee: Secondary | ICD-10-CM | POA: Diagnosis present

## 2015-02-24 DIAGNOSIS — E039 Hypothyroidism, unspecified: Secondary | ICD-10-CM | POA: Diagnosis present

## 2015-02-24 DIAGNOSIS — Z01812 Encounter for preprocedural laboratory examination: Secondary | ICD-10-CM

## 2015-02-24 DIAGNOSIS — M1711 Unilateral primary osteoarthritis, right knee: Secondary | ICD-10-CM | POA: Diagnosis present

## 2015-02-24 DIAGNOSIS — Z85828 Personal history of other malignant neoplasm of skin: Secondary | ICD-10-CM | POA: Diagnosis not present

## 2015-02-24 DIAGNOSIS — Z8249 Family history of ischemic heart disease and other diseases of the circulatory system: Secondary | ICD-10-CM | POA: Diagnosis not present

## 2015-02-24 DIAGNOSIS — Z85818 Personal history of malignant neoplasm of other sites of lip, oral cavity, and pharynx: Secondary | ICD-10-CM

## 2015-02-24 DIAGNOSIS — M179 Osteoarthritis of knee, unspecified: Secondary | ICD-10-CM | POA: Diagnosis present

## 2015-02-24 DIAGNOSIS — M1710 Unilateral primary osteoarthritis, unspecified knee: Secondary | ICD-10-CM | POA: Diagnosis present

## 2015-02-24 HISTORY — PX: TOTAL KNEE ARTHROPLASTY: SHX125

## 2015-02-24 LAB — TYPE AND SCREEN
ABO/RH(D): O NEG
ANTIBODY SCREEN: NEGATIVE

## 2015-02-24 SURGERY — ARTHROPLASTY, KNEE, TOTAL
Anesthesia: Spinal | Site: Knee | Laterality: Right

## 2015-02-24 MED ORDER — BISACODYL 10 MG RE SUPP
10.0000 mg | Freq: Every day | RECTAL | Status: DC | PRN
Start: 2015-02-24 — End: 2015-02-26

## 2015-02-24 MED ORDER — METOCLOPRAMIDE HCL 5 MG/ML IJ SOLN: 5.0000 mg | Freq: Three times a day (TID) | INTRAMUSCULAR | Status: DC | PRN

## 2015-02-24 MED ORDER — MIDAZOLAM HCL 5 MG/5ML IJ SOLN
INTRAMUSCULAR | Status: DC | PRN
  Administered 2015-02-24: 2 mg via INTRAVENOUS

## 2015-02-24 MED ORDER — PHENOL 1.4 % MT LIQD: 1.0000 | OROMUCOSAL | Status: DC | PRN

## 2015-02-24 MED ORDER — SODIUM CHLORIDE 0.9 % IJ SOLN
INTRAMUSCULAR | Status: AC
  Filled 2015-02-24: qty 50

## 2015-02-24 MED ORDER — PROPOFOL 10 MG/ML IV BOLUS
INTRAVENOUS | Status: AC
Start: 2015-02-24 — End: 2015-02-24
  Filled 2015-02-24: qty 20

## 2015-02-24 MED ORDER — ACETAMINOPHEN 650 MG RE SUPP: 650.0000 mg | Freq: Four times a day (QID) | RECTAL | Status: DC | PRN

## 2015-02-24 MED ORDER — LACTATED RINGERS IV SOLN
INTRAVENOUS | Status: DC
  Administered 2015-02-24: 1000 mL via INTRAVENOUS
  Administered 2015-02-24 (×2): via INTRAVENOUS

## 2015-02-24 MED ORDER — PROPOFOL 500 MG/50ML IV EMUL
INTRAVENOUS | Status: DC | PRN
  Administered 2015-02-24: 75 ug/kg/min via INTRAVENOUS

## 2015-02-24 MED ORDER — BUPIVACAINE IN DEXTROSE 0.75-8.25 % IT SOLN
INTRATHECAL | Status: DC | PRN
  Administered 2015-02-24: 1.8 mL via INTRATHECAL

## 2015-02-24 MED ORDER — BUPIVACAINE LIPOSOME 1.3 % IJ SUSP
20.0000 mL | Freq: Once | INTRAMUSCULAR | Status: DC
  Filled 2015-02-24: qty 20

## 2015-02-24 MED ORDER — SODIUM CHLORIDE 0.9 % IV SOLN: INTRAVENOUS | Status: DC

## 2015-02-24 MED ORDER — CEFAZOLIN SODIUM-DEXTROSE 2-3 GM-% IV SOLR
2.0000 g | Freq: Four times a day (QID) | INTRAVENOUS | Status: AC
  Administered 2015-02-24 – 2015-02-25 (×2): 2 g via INTRAVENOUS
  Filled 2015-02-24 (×2): qty 50

## 2015-02-24 MED ORDER — SODIUM CHLORIDE 0.9 % IJ SOLN
INTRAMUSCULAR | Status: DC | PRN
  Administered 2015-02-24: 30 mL

## 2015-02-24 MED ORDER — MENTHOL 3 MG MT LOZG: 1.0000 | LOZENGE | OROMUCOSAL | Status: DC | PRN

## 2015-02-24 MED ORDER — DEXTROSE 5 % IV SOLN
500.0000 mg | Freq: Four times a day (QID) | INTRAVENOUS | Status: DC | PRN
  Administered 2015-02-24: 500 mg via INTRAVENOUS
  Filled 2015-02-24 (×2): qty 5

## 2015-02-24 MED ORDER — HYDROMORPHONE HCL 1 MG/ML IJ SOLN
0.2500 mg | INTRAMUSCULAR | Status: DC | PRN
  Administered 2015-02-24 (×2): 0.5 mg via INTRAVENOUS

## 2015-02-24 MED ORDER — VENLAFAXINE HCL ER 75 MG PO CP24
75.0000 mg | ORAL_CAPSULE | Freq: Two times a day (BID) | ORAL | Status: DC
  Administered 2015-02-24 – 2015-02-26 (×4): 75 mg via ORAL
  Filled 2015-02-24 (×5): qty 1

## 2015-02-24 MED ORDER — ONDANSETRON HCL 4 MG PO TABS: 4.0000 mg | ORAL_TABLET | Freq: Four times a day (QID) | ORAL | Status: DC | PRN

## 2015-02-24 MED ORDER — MIDAZOLAM HCL 2 MG/2ML IJ SOLN
INTRAMUSCULAR | Status: AC
  Filled 2015-02-24: qty 2

## 2015-02-24 MED ORDER — PROMETHAZINE HCL 25 MG/ML IJ SOLN
INTRAMUSCULAR | Status: AC
  Filled 2015-02-24: qty 1

## 2015-02-24 MED ORDER — BUPIVACAINE HCL 0.25 % IJ SOLN
INTRAMUSCULAR | Status: DC | PRN
  Administered 2015-02-24: 20 mL

## 2015-02-24 MED ORDER — ACETAMINOPHEN 10 MG/ML IV SOLN
1000.0000 mg | Freq: Once | INTRAVENOUS | Status: AC
  Administered 2015-02-24: 1000 mg via INTRAVENOUS

## 2015-02-24 MED ORDER — FLUTICASONE PROPIONATE 50 MCG/ACT NA SUSP
2.0000 | Freq: Every day | NASAL | Status: DC
  Administered 2015-02-25 – 2015-02-26 (×2): 2 via NASAL
  Filled 2015-02-24: qty 16

## 2015-02-24 MED ORDER — SODIUM CHLORIDE 0.9 % IV SOLN
INTRAVENOUS | Status: DC
  Administered 2015-02-24 – 2015-02-25 (×2): via INTRAVENOUS

## 2015-02-24 MED ORDER — DEXAMETHASONE SODIUM PHOSPHATE 10 MG/ML IJ SOLN
10.0000 mg | Freq: Once | INTRAMUSCULAR | Status: AC
  Administered 2015-02-24: 10 mg via INTRAVENOUS

## 2015-02-24 MED ORDER — FENTANYL CITRATE (PF) 100 MCG/2ML IJ SOLN
INTRAMUSCULAR | Status: DC | PRN
  Administered 2015-02-24: 100 ug via INTRAVENOUS

## 2015-02-24 MED ORDER — HYDROMORPHONE HCL 1 MG/ML IJ SOLN
INTRAMUSCULAR | Status: AC
  Filled 2015-02-24: qty 1

## 2015-02-24 MED ORDER — ACETAMINOPHEN 500 MG PO TABS
1000.0000 mg | ORAL_TABLET | Freq: Four times a day (QID) | ORAL | Status: AC
  Administered 2015-02-24 – 2015-02-25 (×4): 1000 mg via ORAL
  Filled 2015-02-24 (×4): qty 2

## 2015-02-24 MED ORDER — RIVAROXABAN 10 MG PO TABS
10.0000 mg | ORAL_TABLET | Freq: Every day | ORAL | Status: DC
  Administered 2015-02-25 – 2015-02-26 (×2): 10 mg via ORAL
  Filled 2015-02-24 (×3): qty 1

## 2015-02-24 MED ORDER — METHOCARBAMOL 500 MG PO TABS
500.0000 mg | ORAL_TABLET | Freq: Four times a day (QID) | ORAL | Status: DC | PRN
  Administered 2015-02-24 – 2015-02-26 (×3): 500 mg via ORAL
  Filled 2015-02-24 (×3): qty 1

## 2015-02-24 MED ORDER — METOCLOPRAMIDE HCL 10 MG PO TABS: 5.0000 mg | ORAL_TABLET | Freq: Three times a day (TID) | ORAL | Status: DC | PRN

## 2015-02-24 MED ORDER — ACETAMINOPHEN 10 MG/ML IV SOLN
INTRAVENOUS | Status: AC
  Filled 2015-02-24: qty 100

## 2015-02-24 MED ORDER — PHENYLEPHRINE HCL 10 MG/ML IJ SOLN
10.0000 mg | INTRAVENOUS | Status: DC | PRN
  Administered 2015-02-24: 50 ug/min via INTRAVENOUS

## 2015-02-24 MED ORDER — CHLORHEXIDINE GLUCONATE 4 % EX LIQD: 60.0000 mL | Freq: Once | CUTANEOUS | Status: DC

## 2015-02-24 MED ORDER — CEFAZOLIN SODIUM-DEXTROSE 2-3 GM-% IV SOLR
INTRAVENOUS | Status: AC
  Filled 2015-02-24: qty 50

## 2015-02-24 MED ORDER — ONDANSETRON HCL 4 MG/2ML IJ SOLN: 4.0000 mg | Freq: Four times a day (QID) | INTRAMUSCULAR | Status: DC | PRN

## 2015-02-24 MED ORDER — TRANEXAMIC ACID 1000 MG/10ML IV SOLN
2000.0000 mg | INTRAVENOUS | Status: DC | PRN
  Administered 2015-02-24: 2000 mg via TOPICAL

## 2015-02-24 MED ORDER — BUPIVACAINE HCL (PF) 0.25 % IJ SOLN
INTRAMUSCULAR | Status: AC
  Filled 2015-02-24: qty 30

## 2015-02-24 MED ORDER — LEVOTHYROXINE SODIUM 112 MCG PO TABS
112.0000 ug | ORAL_TABLET | Freq: Every day | ORAL | Status: DC
  Administered 2015-02-25 – 2015-02-26 (×2): 112 ug via ORAL
  Filled 2015-02-24 (×3): qty 1

## 2015-02-24 MED ORDER — BUPIVACAINE LIPOSOME 1.3 % IJ SUSP
INTRAMUSCULAR | Status: DC | PRN
  Administered 2015-02-24: 20 mL

## 2015-02-24 MED ORDER — PROPOFOL 10 MG/ML IV BOLUS
INTRAVENOUS | Status: AC
  Filled 2015-02-24: qty 20

## 2015-02-24 MED ORDER — ONDANSETRON HCL 4 MG/2ML IJ SOLN
INTRAMUSCULAR | Status: DC | PRN
  Administered 2015-02-24: 4 mg via INTRAVENOUS

## 2015-02-24 MED ORDER — TRANEXAMIC ACID 1000 MG/10ML IV SOLN
2000.0000 mg | Freq: Once | INTRAVENOUS | Status: DC
  Filled 2015-02-24: qty 20

## 2015-02-24 MED ORDER — PROMETHAZINE HCL 25 MG/ML IJ SOLN
6.2500 mg | INTRAMUSCULAR | Status: DC | PRN
  Administered 2015-02-24: 6.25 mg via INTRAVENOUS

## 2015-02-24 MED ORDER — FLEET ENEMA 7-19 GM/118ML RE ENEM: 1.0000 | ENEMA | Freq: Once | RECTAL | Status: DC | PRN

## 2015-02-24 MED ORDER — MORPHINE SULFATE (PF) 2 MG/ML IV SOLN
1.0000 mg | INTRAVENOUS | Status: DC | PRN
  Administered 2015-02-26: 1 mg via INTRAVENOUS
  Filled 2015-02-24: qty 1

## 2015-02-24 MED ORDER — DIPHENHYDRAMINE HCL 12.5 MG/5ML PO ELIX: 12.5000 mg | ORAL_SOLUTION | ORAL | Status: DC | PRN

## 2015-02-24 MED ORDER — ACETAMINOPHEN 325 MG PO TABS: 650.0000 mg | ORAL_TABLET | Freq: Four times a day (QID) | ORAL | Status: DC | PRN

## 2015-02-24 MED ORDER — DOCUSATE SODIUM 100 MG PO CAPS
100.0000 mg | ORAL_CAPSULE | Freq: Two times a day (BID) | ORAL | Status: DC
  Administered 2015-02-24 – 2015-02-26 (×4): 100 mg via ORAL

## 2015-02-24 MED ORDER — OXYCODONE HCL 5 MG PO TABS
5.0000 mg | ORAL_TABLET | ORAL | Status: DC | PRN
  Administered 2015-02-24 – 2015-02-26 (×13): 10 mg via ORAL
  Filled 2015-02-24 (×13): qty 2

## 2015-02-24 MED ORDER — CEFAZOLIN SODIUM-DEXTROSE 2-3 GM-% IV SOLR
2.0000 g | INTRAVENOUS | Status: AC
  Administered 2015-02-24: 2 g via INTRAVENOUS

## 2015-02-24 MED ORDER — FENTANYL CITRATE (PF) 100 MCG/2ML IJ SOLN
INTRAMUSCULAR | Status: AC
  Filled 2015-02-24: qty 2

## 2015-02-24 MED ORDER — POLYETHYLENE GLYCOL 3350 17 G PO PACK: 17.0000 g | PACK | Freq: Every day | ORAL | Status: DC | PRN

## 2015-02-24 MED ORDER — DEXAMETHASONE SODIUM PHOSPHATE 10 MG/ML IJ SOLN
10.0000 mg | Freq: Once | INTRAMUSCULAR | Status: AC
  Administered 2015-02-25: 10 mg via INTRAVENOUS
  Filled 2015-02-24: qty 1

## 2015-02-24 SURGICAL SUPPLY — 51 items
BAG DECANTER FOR FLEXI CONT (MISCELLANEOUS) ×3 IMPLANT
BAG SPEC THK2 15X12 ZIP CLS (MISCELLANEOUS) ×1
BAG ZIPLOCK 12X15 (MISCELLANEOUS) ×3 IMPLANT
BANDAGE ACE 6X5 VEL STRL LF (GAUZE/BANDAGES/DRESSINGS) ×3 IMPLANT
BLADE SAG 18X100X1.27 (BLADE) ×3 IMPLANT
BLADE SAW SGTL 11.0X1.19X90.0M (BLADE) ×3 IMPLANT
BOWL SMART MIX CTS (DISPOSABLE) ×3 IMPLANT
CAPT KNEE TOTAL 3 ATTUNE ×2 IMPLANT
CEMENT HV SMART SET (Cement) ×6 IMPLANT
CLOSURE WOUND 1/2 X4 (GAUZE/BANDAGES/DRESSINGS) ×1
CLOTH BEACON ORANGE TIMEOUT ST (SAFETY) ×3 IMPLANT
CUFF TOURN SGL QUICK 34 (TOURNIQUET CUFF) ×3
CUFF TRNQT CYL 34X4X40X1 (TOURNIQUET CUFF) ×1 IMPLANT
DECANTER SPIKE VIAL GLASS SM (MISCELLANEOUS) ×3 IMPLANT
DRAPE U-SHAPE 47X51 STRL (DRAPES) ×3 IMPLANT
DRSG ADAPTIC 3X8 NADH LF (GAUZE/BANDAGES/DRESSINGS) ×3 IMPLANT
DRSG PAD ABDOMINAL 8X10 ST (GAUZE/BANDAGES/DRESSINGS) ×3 IMPLANT
DURAPREP 26ML APPLICATOR (WOUND CARE) ×3 IMPLANT
ELECT REM PT RETURN 9FT ADLT (ELECTROSURGICAL) ×3
ELECTRODE REM PT RTRN 9FT ADLT (ELECTROSURGICAL) ×1 IMPLANT
EVACUATOR 1/8 PVC DRAIN (DRAIN) ×3 IMPLANT
GAUZE SPONGE 4X4 12PLY STRL (GAUZE/BANDAGES/DRESSINGS) ×3 IMPLANT
GLOVE BIO SURGEON STRL SZ7.5 (GLOVE) ×2 IMPLANT
GLOVE BIO SURGEON STRL SZ8 (GLOVE) ×3 IMPLANT
GLOVE BIOGEL PI IND STRL 6.5 (GLOVE) IMPLANT
GLOVE BIOGEL PI IND STRL 8 (GLOVE) ×1 IMPLANT
GLOVE BIOGEL PI INDICATOR 6.5 (GLOVE)
GLOVE BIOGEL PI INDICATOR 8 (GLOVE) ×4
GLOVE SURG SS PI 6.5 STRL IVOR (GLOVE) IMPLANT
GOWN STRL REUS W/TWL LRG LVL3 (GOWN DISPOSABLE) ×3 IMPLANT
GOWN STRL REUS W/TWL XL LVL3 (GOWN DISPOSABLE) ×2 IMPLANT
HANDPIECE INTERPULSE COAX TIP (DISPOSABLE) ×3
IMMOBILIZER KNEE 20 (SOFTGOODS) ×3
IMMOBILIZER KNEE 20 THIGH 36 (SOFTGOODS) ×1 IMPLANT
MANIFOLD NEPTUNE II (INSTRUMENTS) ×3 IMPLANT
NS IRRIG 1000ML POUR BTL (IV SOLUTION) ×3 IMPLANT
PACK TOTAL KNEE CUSTOM (KITS) ×3 IMPLANT
PADDING CAST COTTON 6X4 STRL (CAST SUPPLIES) ×7 IMPLANT
POSITIONER SURGICAL ARM (MISCELLANEOUS) ×3 IMPLANT
SET HNDPC FAN SPRY TIP SCT (DISPOSABLE) ×1 IMPLANT
STRIP CLOSURE SKIN 1/2X4 (GAUZE/BANDAGES/DRESSINGS) ×3 IMPLANT
SUT MNCRL AB 4-0 PS2 18 (SUTURE) ×3 IMPLANT
SUT VIC AB 2-0 CT1 27 (SUTURE) ×9
SUT VIC AB 2-0 CT1 TAPERPNT 27 (SUTURE) ×3 IMPLANT
SUT VLOC 180 0 24IN GS25 (SUTURE) ×3 IMPLANT
SYR 50ML LL SCALE MARK (SYRINGE) ×3 IMPLANT
TRAY FOLEY W/METER SILVER 14FR (SET/KITS/TRAYS/PACK) ×3 IMPLANT
TRAY FOLEY W/METER SILVER 16FR (SET/KITS/TRAYS/PACK) ×1 IMPLANT
WATER STERILE IRR 1500ML POUR (IV SOLUTION) ×3 IMPLANT
WRAP KNEE MAXI GEL POST OP (GAUZE/BANDAGES/DRESSINGS) ×3 IMPLANT
YANKAUER SUCT BULB TIP 10FT TU (MISCELLANEOUS) ×3 IMPLANT

## 2015-02-24 NOTE — Anesthesia Procedure Notes (Signed)
Spinal Patient location during procedure: OR End time: 02/24/2015 12:45 PM Staffing Resident/CRNA: Enrigue Catena E Performed by: anesthesiologist and resident/CRNA  Preanesthetic Checklist Completed: patient identified, site marked, surgical consent, pre-op evaluation, timeout performed, IV checked, risks and benefits discussed and monitors and equipment checked Spinal Block Patient position: sitting Prep: Betadine Patient monitoring: heart rate, continuous pulse ox and blood pressure Location: L3-4 Injection technique: single-shot Needle Needle type: Spinocan  Needle gauge: 22 G Needle length: 9 cm Assessment Sensory level: T4 Additional Notes Expiration date of kit checked and confirmed. Patient tolerated procedure well, without complications.
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
## 2015-02-24 NOTE — Anesthesia Preprocedure Evaluation (Signed)
Anesthesia Evaluation  Patient identified by MRN, date of birth, ID band Patient awake    Reviewed: Allergy & Precautions, NPO status , Patient's Chart, lab work & pertinent test results  Airway Mallampati: II  TM Distance: >3 FB Neck ROM: Full    Dental no notable dental hx.    Pulmonary neg pulmonary ROS,    Pulmonary exam normal breath sounds clear to auscultation       Cardiovascular negative cardio ROS Normal cardiovascular exam Rhythm:Regular Rate:Normal     Neuro/Psych negative neurological ROS  negative psych ROS   GI/Hepatic negative GI ROS, Neg liver ROS,   Endo/Other  Hypothyroidism   Renal/GU negative Renal ROS  negative genitourinary   Musculoskeletal negative musculoskeletal ROS (+)   Abdominal   Peds negative pediatric ROS (+)  Hematology negative hematology ROS (+)   Anesthesia Other Findings   Reproductive/Obstetrics negative OB ROS                             Anesthesia Physical Anesthesia Plan  ASA: II  Anesthesia Plan: Spinal   Post-op Pain Management:    Induction: Intravenous  Airway Management Planned: Simple Face Mask  Additional Equipment:   Intra-op Plan:   Post-operative Plan:   Informed Consent: I have reviewed the patients History and Physical, chart, labs and discussed the procedure including the risks, benefits and alternatives for the proposed anesthesia with the patient or authorized representative who has indicated his/her understanding and acceptance.   Dental advisory given  Plan Discussed with: CRNA and Surgeon  Anesthesia Plan Comments:         Anesthesia Quick Evaluation  

## 2015-02-24 NOTE — Interval H&P Note (Signed)
History and Physical Interval Note:  02/24/2015 12:17 PM  Maria Rogers  has presented today for surgery, with the diagnosis of OA RIGHT KNEE   The various methods of treatment have been discussed with the patient and family. After consideration of risks, benefits and other options for treatment, the patient has consented to  Procedure(s): TOTAL RIGHT KNEE ARTHROPLASTY (Right) as a surgical intervention .  The patient's history has been reviewed, patient examined, no change in status, stable for surgery.  I have reviewed the patient's chart and labs.  Questions were answered to the patient's satisfaction.     [name]

## 2015-02-24 NOTE — Progress Notes (Signed)
Utilization review completed.  

## 2015-02-24 NOTE — Anesthesia Postprocedure Evaluation (Signed)
Anesthesia Post Note  Patient: Maria Rogers  Procedure(s) Performed: Procedure(s) (LRB): TOTAL RIGHT KNEE ARTHROPLASTY (Right)  Patient location during evaluation: PACU Anesthesia Type: Spinal Level of consciousness: awake and alert Pain management: pain level controlled Vital Signs Assessment: post-procedure vital signs reviewed and stable Respiratory status: spontaneous breathing and respiratory function stable Cardiovascular status: blood pressure returned to baseline and stable Postop Assessment: no signs of nausea or vomiting Anesthetic complications: no    Last Vitals:  Filed Vitals:   02/24/15 1445 02/24/15 1500  BP: 144/73 151/82  Pulse: 67 65  Temp:  36.4 C  Resp: 7 13    Last Pain:  Filed Vitals:   02/24/15 1506  PainSc: Asleep                 Jenniger Figiel S

## 2015-02-24 NOTE — Op Note (Signed)
Pre-operative diagnosis- Osteoarthritis  Right knee(s)  Post-operative diagnosis- Osteoarthritis Right knee(s)  Procedure-  Right  Total Knee Arthroplasty  Surgeon- Dione Plover. Leelyn Jasinski, MD  Assistant- Arlee Muslim, PA-C   Anesthesia-  Spinal  EBL-* No blood loss amount entered *   Drains Hemovac  Tourniquet time-  Total Tourniquet Time Documented: Thigh (Right) - 31 minutes Total: Thigh (Right) - 31 minutes     Complications- None  Condition-PACU - hemodynamically stable.   Brief Clinical Note  Maria Rogers is a 70 y.o. year old female with end stage OA of her right knee with progressively worsening pain and dysfunction. She has constant pain, with activity and at rest and significant functional deficits with difficulties even with ADLs. She has had extensive non-op management including analgesics, injections of cortisone and viscosupplements, and home exercise program, but remains in significant pain with significant dysfunction.Radiographs show bone on bone arthritis medial and patellofemoral. She presents now for right Total Knee Arthroplasty.    Procedure in detail---   The patient is brought into the operating room and positioned supine on the operating table. After successful administration of  Spinal,   a tourniquet is placed high on the  Right thigh(s) and the lower extremity is prepped and draped in the usual sterile fashion. Time out is performed by the operating team and then the  Right lower extremity is wrapped in Esmarch, knee flexed and the tourniquet inflated to 300 mmHg.       A midline incision is made with a ten blade through the subcutaneous tissue to the level of the extensor mechanism. A fresh blade is used to make a medial parapatellar arthrotomy. Soft tissue over the proximal medial tibia is subperiosteally elevated to the joint line with a knife and into the semimembranosus bursa with a Cobb elevator. Soft tissue over the proximal lateral tibia is elevated  with attention being paid to avoiding the patellar tendon on the tibial tubercle. The patella is everted, knee flexed 90 degrees and the ACL and PCL are removed. Findings are bone on bone medial and patellofemoral with large medial osteophytes.        The drill is used to create a starting hole in the distal femur and the canal is thoroughly irrigated with sterile saline to remove the fatty contents. The 5 degree Right  valgus alignment guide is placed into the femoral canal and the distal femoral cutting block is pinned to remove 10 mm off the distal femur. Resection is made with an oscillating saw.      The tibia is subluxed forward and the menisci are removed. The extramedullary alignment guide is placed referencing proximally at the medial aspect of the tibial tubercle and distally along the second metatarsal axis and tibial crest. The block is pinned to remove 39mm off the more deficient medial  side. Resection is made with an oscillating saw. Size 6is the most appropriate size for the tibia and the proximal tibia is prepared with the modular drill and keel punch for that size.      The femoral sizing guide is placed and size 7 is most appropriate. Rotation is marked off the epicondylar axis and confirmed by creating a rectangular flexion gap at 90 degrees. The size 7 cutting block is pinned in this rotation and the anterior, posterior and chamfer cuts are made with the oscillating saw. The intercondylar block is then placed and that cut is made.      Trial size 6 tibial component, trial  size 7 posterior stabilized femur and a 8  mm posterior stabilized rotating platform insert trial is placed. Full extension is achieved with excellent varus/valgus and anterior/posterior balance throughout full range of motion. The patella is everted and thickness measured to be 25  mm. Free hand resection is taken to 15 mm, a 38 template is placed, lug holes are drilled, trial patella is placed, and it tracks normally.  Osteophytes are removed off the posterior femur with the trial in place. All trials are removed and the cut bone surfaces prepared with pulsatile lavage. Cement is mixed and once ready for implantation, the size 6 tibial implant, size  7 posterior stabilized femoral component, and the size 38 patella are cemented in place and the patella is held with the clamp. The trial insert is placed and the knee held in full extension. The Exparel (20 ml mixed with 30 ml saline) and .25% Bupivicaine, are injected into the extensor mechanism, posterior capsule, medial and lateral gutters and subcutaneous tissues.  All extruded cement is removed and once the cement is hard the permanent 8 mm posterior stabilized rotating platform insert is placed into the tibial tray.      The wound is copiously irrigated with saline solution and the extensor mechanism closed over a hemovac drain with #1 V-loc suture. The tourniquet is released for a total tourniquet time of 31  minutes. Flexion against gravity is 140 degrees and the patella tracks normally. Subcutaneous tissue is closed with 2.0 vicryl and subcuticular with running 4.0 Monocryl. The incision is cleaned and dried and steri-strips and a bulky sterile dressing are applied. The limb is placed into a knee immobilizer and the patient is awakened and transported to recovery in stable condition.      Please note that a surgical assistant was a medical necessity for this procedure in order to perform it in a safe and expeditious manner. Surgical assistant was necessary to retract the ligaments and vital neurovascular structures to prevent injury to them and also necessary for proper positioning of the limb to allow for anatomic placement of the prosthesis.   Dione Plover Toriann Spadoni, MD    02/24/2015, 1:42 PM

## 2015-02-24 NOTE — Transfer of Care (Signed)
Immediate Anesthesia Transfer of Care Note  Patient: Maria Rogers  Procedure(s) Performed: Procedure(s): TOTAL RIGHT KNEE ARTHROPLASTY (Right)  Patient Location: PACU  Anesthesia Type:Spinal  Level of Consciousness: awake, alert , oriented and patient cooperative  Airway & Oxygen Therapy: Patient Spontanous Breathing and Patient connected to face mask oxygen  Post-op Assessment: Report given to RN and Post -op Vital signs reviewed and stable  Post vital signs: stable  Last Vitals:  Filed Vitals:   02/24/15 1017 02/24/15 1411  BP: 123/71 154/87  Pulse: 74   Temp: 36.8 C 36.5 C  Resp: 18 14    Complications: No apparent anesthesia complications  S1 spinal level

## 2015-02-25 LAB — CBC
HEMATOCRIT: 30.4 % — AB (ref 36.0–46.0)
Hemoglobin: 9.8 g/dL — ABNORMAL LOW (ref 12.0–15.0)
MCH: 30.6 pg (ref 26.0–34.0)
MCHC: 32.2 g/dL (ref 30.0–36.0)
MCV: 95 fL (ref 78.0–100.0)
Platelets: 253 10*3/uL (ref 150–400)
RBC: 3.2 MIL/uL — AB (ref 3.87–5.11)
RDW: 14 % (ref 11.5–15.5)
WBC: 6.7 10*3/uL (ref 4.0–10.5)

## 2015-02-25 LAB — BASIC METABOLIC PANEL
ANION GAP: 6 (ref 5–15)
BUN: 12 mg/dL (ref 6–20)
CHLORIDE: 104 mmol/L (ref 101–111)
CO2: 30 mmol/L (ref 22–32)
Calcium: 9 mg/dL (ref 8.9–10.3)
Creatinine, Ser: 0.79 mg/dL (ref 0.44–1.00)
GFR calc Af Amer: >60 mL/min (ref >60)
GLUCOSE: 126 mg/dL — AB (ref 65–99)
POTASSIUM: 4.2 mmol/L (ref 3.5–5.1)
Sodium: 140 mmol/L (ref 135–145)

## 2015-02-25 NOTE — Progress Notes (Signed)
Physical Therapy Evaluation Patient Details Name: Maria Rogers MRN: [id number] DOB: 1945-09-15 Today's Date: 1/12/Rogers   History of Present Illness  R TKR  Clinical Impression  Pt s/p R TKR presents with decreased R LE strength/ROM and post op pain limiting functional mobility.  Pt should progress well to dc home with family assist and HHPT follow up.    Follow Up Recommendations Home health PT    Equipment Recommendations  None recommended by PT    Recommendations for Other Services OT consult     Precautions / Restrictions Precautions Precautions: Knee;Fall Precaution Comments: PT d/c'd KI Required Braces or Orthoses: Knee Immobilizer - Right Knee Immobilizer - Right: Discontinue once straight leg raise with < 10 degree lag (Pt performed IND SLR this am) Restrictions Weight Bearing Restrictions: No Other Position/Activity Restrictions: WBAT      Mobility  Bed Mobility Overal bed mobility: Needs Assistance Bed Mobility: Supine to Sit     Supine to sit: Min assist     General bed mobility comments: oob  Transfers Overall transfer level: Needs assistance Equipment used: Rolling walker (2 wheeled) Transfers: Sit to/from Stand Sit to Stand: Min assist;Min guard         General transfer comment: cues for UE/LE placement  Ambulation/Gait Ambulation/Gait assistance: Min assist Ambulation Distance (Feet): 65 Feet Assistive device: Rolling walker (2 wheeled) Gait Pattern/deviations: Step-to pattern;Decreased step length - right;Decreased step length - left;Shuffle;Trunk flexed Gait velocity: decr Gait velocity interpretation: Below normal speed for age/gender General Gait Details: cues for posture, sequence and position from [employer]            Wheelchair Mobility    Modified Rankin (Stroke Patients Only)       Balance                                             Pertinent Vitals/Pain Pain Assessment: 0-10 Pain Score:  2  Pain Location: R Knee Pain Descriptors / Indicators: Aching Pain Intervention(s): Limited activity within patient's tolerance;Monitored during session;Premedicated before session;Repositioned;Ice applied    Home Living Family/patient expects to be discharged to:: Private residence Living Arrangements: Other (Comment);Children Available Help at Discharge: Family Type of Home: House Home Access: Ramped entrance     Home Layout: One level Home Equipment: Bedside commode;Shower seat Additional Comments: has plenty of assistance    Prior Function Level of Independence: Independent               Hand Dominance        Extremity/Trunk Assessment   Upper Extremity Assessment: Overall WFL for tasks assessed           Lower Extremity Assessment: RLE deficits/detail RLE Deficits / Details: 3/5 quads with pt performing IND SLR.  AAROM at knee -10-  80    Cervical / Trunk Assessment: Normal  Communication   Communication: No difficulties  Cognition Arousal/Alertness: Awake/alert Behavior During Therapy: WFL for tasks assessed/performed Overall Cognitive Status: Within Functional Limits for tasks assessed                      General Comments      Exercises Total Joint Exercises Ankle Circles/Pumps: AROM;15 reps;Both;Supine Quad Sets: AROM;Both;10 reps;Supine Heel Slides: AAROM;15 reps;Right;Supine Straight Leg Raises: Right;Supine;AAROM;AROM;15 reps      Assessment/Plan    PT Assessment Patient needs continued PT services  PT Diagnosis Difficulty walking   PT Problem List Decreased strength;Decreased range of motion;Decreased activity tolerance;Decreased mobility;Decreased knowledge of use of DME;Pain  PT Treatment Interventions DME instruction;Gait training;Functional mobility training;Therapeutic activities;Therapeutic exercise;Patient/family education   PT Goals (Current goals can be found in the Care Plan section) Acute Rehab PT Goals Patient  Stated Goal: Dance again PT Goal Formulation: With patient Time For Goal Achievement: 02/28/15 Potential to Achieve Goals: Good    Frequency 7X/week   Barriers to discharge        Co-evaluation               End of Session Equipment Utilized During Treatment: Gait belt Activity Tolerance: Patient tolerated treatment well Patient left: in chair;with call bell/phone within reach Nurse Communication: Mobility status         Time: [id number] PT Time Calculation (min) (ACUTE ONLY): 32 min   Charges:   PT Evaluation $PT Eval Low Complexity: 1 Procedure PT Treatments $Therapeutic Exercise: 8-22 mins   PT G Codes:        Maria Rogers Maria Rogers, 3:49 PM

## 2015-02-25 NOTE — Progress Notes (Signed)
Pt requires No HH services at discharge. CM will sign Off.

## 2015-02-25 NOTE — Progress Notes (Signed)
OT  Note  Patient Details Name: Maria Rogers MRN: [id number] DOB: Nov 02, 1945   Cancelled Treatment:    Pt currently working with PT. Will recheck on pt later in day. Thanks, Kari Baars, [place] Payton Mccallum D 02/25/2015, 11:56 AM

## 2015-02-25 NOTE — Care Management Note (Addendum)
Case Management Note  Patient Details  Name: [name] MRN: [id number] Date of Birth: 04-14-1945  Subjective/Objective:   70 y.o. F admitted 02/24/15 for R TKA. Awaiting PT eval for [facility] recommendations but anticipate HHPT.  Pt is requesting [facility] for her HHPT.              Action/Plan: Anticipate discharge home with spouse. CM made K Nussbaum aware of HHPT needs.    Expected Discharge Date:                  Expected Discharge Plan:  [place]  In-House Referral:     Discharge planning Services  CM Consult  Post Acute Care Choice:  Durable Medical Equipment, Home Health Choice offered to:  Patient  DME Arranged:    DME Agency:  [facility] Arranged:  PT [facility] Agency:  [place]  Status of Service:  In process, will continue to follow  Medicare Important Message Given:    Date Medicare IM Given:    Medicare IM give by:    Date Additional Medicare IM Given:    Additional Medicare Important Message give by:     If discussed at [place] of Stay Meetings, dates discussed:    Additional Comments:  Delrae Sawyers, RN 02/25/2015, 1:02 PM

## 2015-02-25 NOTE — Evaluation (Signed)
Occupational Therapy Evaluation Patient Details Name: Maria Rogers MRN: [id number] DOB: May 19, 1945 Today's Date: 02/25/2015    History of Present Illness R TKR   Clinical Impression   This 70 year old female was admitted for the above surgery.  All education was completed. No further OT is needed at this tijme    Follow Up Recommendations  No OT follow up    Equipment Recommendations  None recommended by OT    Recommendations for Other Services       Precautions / Restrictions Precautions Precautions: Knee;Fall Precaution Comments: PT d/c'd KI Required Braces or Orthoses: Knee Immobilizer - Right Knee Immobilizer - Right: Discontinue once straight leg raise with < 10 degree lag (Pt performed IND SLR this am) Restrictions Weight Bearing Restrictions: No Other Position/Activity Restrictions: WBAT      Mobility Bed Mobility            General bed mobility comments: oob  Transfers Overall transfer level: Needs assistance Equipment used: Rolling walker (2 wheeled) Transfers: Sit to/from Stand Sit to [employer] transfer comment: cues for UE/LE placement    Balance                                            ADL Overall ADL's : Needs assistance/impaired     Grooming: Oral care;Supervision/safety;Standing       Lower Body Bathing: Minimal assistance;Sit to/from stand       Lower Body Dressing: Minimal assistance;Sit to/from stand   Toilet Transfer: Min guard (chair)       Tub/ Shower Transfer: Walk-in shower;Min guard     General ADL Comments: Pt can perform UB adls with set up. pt will have assist as needed.  Reviewed precautions & pt verbalizes understanding     Vision     Perception     Praxis      Pertinent Vitals/Pain Pain Assessment: 0-10 Pain Score: 2  Pain Location: R Knee Pain Descriptors / Indicators: Aching Pain Intervention(s): Limited activity within patient's  tolerance;Monitored during session;Premedicated before session;Repositioned;Ice applied     Hand Dominance     Extremity/Trunk Assessment Upper Extremity Assessment Upper Extremity Assessment: Overall WFL for tasks assessed      Cervical / Trunk Assessment Cervical / Trunk Assessment: Normal   Communication Communication Communication: No difficulties   Cognition Arousal/Alertness: Awake/alert Behavior During Therapy: WFL for tasks assessed/performed Overall Cognitive Status: Within Functional Limits for tasks assessed                     General Comments       Exercises      Shoulder Instructions      Home Living Family/patient expects to be discharged to:: Private residence Living Arrangements: Other (Comment);Children Available Help at Discharge: Family Type of Home: House Home Access: Ramped entrance     Home Layout: One level     Bathroom Shower/Tub: Occupational psychologist: Handicapped height     Home Equipment: Bedside commode;Shower seat   Additional Comments: has plenty of assistance      Prior Functioning/Environment Level of Independence: Independent             OT Diagnosis: Acute pain   OT Problem List:     OT Treatment/Interventions:      OT Goals(Current goals can  be found in the care plan section) Acute Rehab OT Goals Patient Stated Goal: Dance again OT Goal Formulation: All assessment and education complete, DC therapy  OT Frequency:     Barriers to D/C:            Co-evaluation              End of Session    Activity Tolerance: Patient tolerated treatment well Patient left: in chair;with call bell/phone within reach   Time: 1347-1402 OT Time Calculation (min): 15 min Charges:  OT General Charges $OT Visit: 1 Procedure OT Evaluation $OT Eval Low Complexity: 1 Procedure G-Codes:    Shalondra Wunschel 03-24-2015, 3:49 PM Lesle Chris, OTR/L [phone] 03-24-15

## 2015-02-25 NOTE — Progress Notes (Signed)
Physical Therapy Treatment Patient Details Name: LATERRIA GOODIE MRN: [id number] DOB: [birth year] Today's Date: 03-03-15    History of Present Illness R TKR    PT Comments    Pt progressing well with mobility and hopeful for dc home tomorrow.  Follow Up Recommendations  Home health PT     Equipment Recommendations  None recommended by PT    Recommendations for Other Services OT consult     Precautions / Restrictions Precautions Precautions: Knee;Fall Precaution Comments: PT d/c'd KI Restrictions Weight Bearing Restrictions: No Other Position/Activity Restrictions: WBAT    Mobility  Bed Mobility Overal bed mobility: Needs Assistance Bed Mobility: Supine to Sit;Sit to Supine     Supine to sit: Min guard Sit to supine: Min guard   General bed mobility comments: min cues for sequence and use of L LE to self assist  Transfers Overall transfer level: Needs assistance Equipment used: Rolling walker (2 wheeled) Transfers: Sit to/from Stand Sit to Stand: Min guard         General transfer comment: cues for UE/LE placement  Ambulation/Gait Ambulation/Gait assistance: Min guard Ambulation Distance (Feet): 70 Feet (and 20' into bathroom) Assistive device: Rolling walker (2 wheeled) Gait Pattern/deviations: Step-to pattern;Decreased step length - right;Decreased step length - left;Shuffle;Trunk flexed Gait velocity: decr   General Gait Details: cues for posture, sequence and position from [employer] Mobility    Modified Rankin (Stroke Patients Only)       Balance                                    Cognition Arousal/Alertness: Awake/alert Behavior During Therapy: WFL for tasks assessed/performed Overall Cognitive Status: Within Functional Limits for tasks assessed                      Exercises      General Comments        Pertinent Vitals/Pain Pain Assessment: 0-10 Pain Score: 3  Pain  Location: R knee Pain Descriptors / Indicators: Aching;Sore Pain Intervention(s): Limited activity within patient's tolerance;Monitored during session;Premedicated before session    Home Living Family/patient expects to be discharged to:: Private residence Living Arrangements: Other (Comment);Children Available Help at Discharge: Family         Home Equipment: Bedside commode;Shower seat Additional Comments: has plenty of assistance    Prior Function            PT Goals (current goals can now be found in the care plan section) Acute Rehab PT Goals Patient Stated Goal: Dance again PT Goal Formulation: With patient Time For Goal Achievement: 02/28/15 Potential to Achieve Goals: Good Progress towards PT goals: Progressing toward goals    Frequency  7X/week    PT Plan Current plan remains appropriate    Co-evaluation             End of Session Equipment Utilized During Treatment: Gait belt Activity Tolerance: Patient tolerated treatment well Patient left: in bed;with call bell/phone within reach;in CPM     Time: [id number] PT Time Calculation (min) (ACUTE ONLY): 21 min  Charges:  $Gait Training: 8-22 mins                    G Codes:      Lillan Mccreadie 03/03/15, 4:51 PM

## 2015-02-25 NOTE — Progress Notes (Signed)
   Subjective: 1 Day Post-Op Procedure(s) (LRB): TOTAL RIGHT KNEE ARTHROPLASTY (Right) Patient reports pain as mild.   We will start therapy today.  Plan is to go Home after hospital stay.  Objective: Vital signs in last 24 hours: Temp:  [97.6 F (36.4 C)-98.9 F (37.2 C)] 98.7 F (37.1 C) (01/12 0355) Pulse Rate:  [65-84] 73 (01/12 0355) Resp:  [7-18] 14 (01/12 0355) BP: (102-171)/(58-92) 102/58 mmHg (01/12 0355) SpO2:  [100 %] 100 % (01/12 0355) Weight:  [58.968 kg (130 lb)] 58.968 kg (130 lb) (01/11 1700)  Intake/Output from previous day:  Intake/Output Summary (Last 24 hours) at 02/25/15 0654 Last data filed at 02/25/15 0610  Gross per 24 hour  Intake   4270 ml  Output   2145 ml  Net   2125 ml    Intake/Output this shift: Total I/O In: 1020 [P.O.:120; I.V.:850; IV Piggyback:50] Out: 1595 [Urine:1550; Drains:45]  Labs:  Recent Labs  02/25/15 0420  HGB 9.8*    Recent Labs  02/25/15 0420  WBC 6.7  RBC 3.20*  HCT 30.4*  PLT 253    Recent Labs  02/25/15 0420  NA 140  K 4.2  CL 104  CO2 30  BUN 12  CREATININE 0.79  GLUCOSE 126*  CALCIUM 9.0   No results for input(s): LABPT, INR in the last 72 hours.  EXAM General - Patient is Alert, Appropriate and Oriented Extremity - Neurologically intact Neurovascular intact No cellulitis present Compartment soft Dressing - dressing C/D/I Motor Function - intact, moving foot and toes well on exam.  Hemovac pulled without difficulty.  Past Medical History  Diagnosis Date  . Hypothyroidism   . Depression   . Arthritis   . History of skin cancer   . Cancer ([place])     cancer of parotid gland / hx skin cancer    Assessment/Plan: 1 Day Post-Op Procedure(s) (LRB): TOTAL RIGHT KNEE ARTHROPLASTY (Right) Principal Problem:   OA (osteoarthritis) of knee   Advance diet Up with therapy D/C IV fluids Plan for discharge tomorrow  DVT Prophylaxis - Xarelto Weight-Bearing as tolerated to right  leg  Cheryel Kyte V 02/25/2015, 6:54 AM

## 2015-02-26 LAB — CBC
HEMATOCRIT: 27.9 % — AB (ref 36.0–46.0)
HEMOGLOBIN: 8.9 g/dL — AB (ref 12.0–15.0)
MCH: 30 pg (ref 26.0–34.0)
MCHC: 31.9 g/dL (ref 30.0–36.0)
MCV: 93.9 fL (ref 78.0–100.0)
Platelets: 232 10*3/uL (ref 150–400)
RBC: 2.97 MIL/uL — ABNORMAL LOW (ref 3.87–5.11)
RDW: 14 % (ref 11.5–15.5)
WBC: 5.8 10*3/uL (ref 4.0–10.5)

## 2015-02-26 LAB — BASIC METABOLIC PANEL
ANION GAP: 7 (ref 5–15)
BUN: 13 mg/dL (ref 6–20)
CHLORIDE: 104 mmol/L (ref 101–111)
CO2: 30 mmol/L (ref 22–32)
Calcium: 9.3 mg/dL (ref 8.9–10.3)
Creatinine, Ser: 0.68 mg/dL (ref 0.44–1.00)
GFR calc non Af Amer: >60 mL/min (ref >60)
Glucose, Bld: 106 mg/dL — ABNORMAL HIGH (ref 65–99)
Potassium: 3.9 mmol/L (ref 3.5–5.1)
Sodium: 141 mmol/L (ref 135–145)

## 2015-02-26 MED ORDER — METHOCARBAMOL 500 MG PO TABS: 500.0000 mg | ORAL_TABLET | Freq: Four times a day (QID) | ORAL | Status: DC | PRN

## 2015-02-26 MED ORDER — RIVAROXABAN 10 MG PO TABS: 10.0000 mg | ORAL_TABLET | Freq: Every day | ORAL | Status: DC

## 2015-02-26 MED ORDER — POLYSACCHARIDE IRON COMPLEX 150 MG PO CAPS
150.0000 mg | ORAL_CAPSULE | Freq: Every day | ORAL | Status: DC
  Administered 2015-02-26: 150 mg via ORAL
  Filled 2015-02-26: qty 1

## 2015-02-26 MED ORDER — POLYSACCHARIDE IRON COMPLEX 150 MG PO CAPS: 150.0000 mg | ORAL_CAPSULE | Freq: Two times a day (BID) | ORAL | Status: DC

## 2015-02-26 MED ORDER — OXYCODONE HCL 5 MG PO TABS: 5.0000 mg | ORAL_TABLET | ORAL | Status: DC | PRN

## 2015-02-26 MED ORDER — TRAMADOL HCL 50 MG PO TABS: 50.0000 mg | ORAL_TABLET | Freq: Four times a day (QID) | ORAL | Status: DC | PRN

## 2015-02-26 MED ORDER — TRAMADOL HCL 50 MG PO TABS
50.0000 mg | ORAL_TABLET | Freq: Four times a day (QID) | ORAL | Status: DC | PRN
  Administered 2015-02-26: 100 mg via ORAL
  Filled 2015-02-26: qty 2

## 2015-02-26 NOTE — Progress Notes (Signed)
Physical Therapy Treatment Patient Details Name: Maria Rogers MRN: [id number] DOB: 1945-12-24 Today's Date: 02/26/2015    History of Present Illness R TKR    PT Comments    Pt progressing well with mobility and reviewed therex and don/doff KI  Follow Up Recommendations  Home health PT     Equipment Recommendations  None recommended by PT    Recommendations for Other Services OT consult     Precautions / Restrictions Precautions Precautions: Knee;Fall Precaution Comments: PT d/c'd KI Restrictions Weight Bearing Restrictions: No Other Position/Activity Restrictions: WBAT    Mobility  Bed Mobility Overal bed mobility: Needs Assistance Bed Mobility: Supine to Sit;Sit to Supine     Supine to sit: Supervision Sit to supine: Supervision   General bed mobility comments: min cues for sequence.  Pt using UEs to self assist R LE  Transfers Overall transfer level: Needs assistance Equipment used: Rolling walker (2 wheeled) Transfers: Sit to/from Stand Sit to Stand: Supervision         General transfer comment: min cues for UE/LE placement  Ambulation/Gait Ambulation/Gait assistance: Min guard;Supervision Ambulation Distance (Feet): 150 Feet (and 20' from bathroom) Assistive device: Rolling walker (2 wheeled) Gait Pattern/deviations: Step-to pattern;Step-through pattern;Decreased step length - right;Decreased step length - left;Shuffle;Trunk flexed Gait velocity: decr Gait velocity interpretation: Below normal speed for age/gender General Gait Details: cues for posture, sequence and position from [employer]            Wheelchair Mobility    Modified Rankin (Stroke Patients Only)       Balance                                    Cognition Arousal/Alertness: Awake/alert Behavior During Therapy: WFL for tasks assessed/performed Overall Cognitive Status: Within Functional Limits for tasks assessed                       Exercises Total Joint Exercises Ankle Circles/Pumps: AROM;15 reps;Both;Supine Quad Sets: AROM;Both;Supine;15 reps Heel Slides: Right;Supine;AAROM;20 reps Straight Leg Raises: Right;Supine;AAROM;AROM;20 reps Goniometric ROM: AAROM R knee -10-  85    General Comments        Pertinent Vitals/Pain Pain Assessment: 0-10 Pain Score: 4  Pain Location: R knee Pain Descriptors / Indicators: Aching;Sore Pain Intervention(s): Limited activity within patient's tolerance;Monitored during session;Premedicated before session;Ice applied    Home Living                      Prior Function            PT Goals (current goals can now be found in the care plan section) Acute Rehab PT Goals Patient Stated Goal: Dance again PT Goal Formulation: With patient Time For Goal Achievement: 02/28/15 Potential to Achieve Goals: Good Progress towards PT goals: Progressing toward goals    Frequency  7X/week    PT Plan Current plan remains appropriate    Co-evaluation             End of Session Equipment Utilized During Treatment: Gait belt Activity Tolerance: Patient tolerated treatment well Patient left: in bed;with call bell/phone within reach     Time: 0914-1003 PT Time Calculation (min) (ACUTE ONLY): 49 min  Charges:  $Gait Training: 8-22 mins $Therapeutic Exercise: 8-22 mins $Therapeutic Activity: 8-22 mins  G Codes:      Paarth Cropper March 11, 2015, 10:41 AM

## 2015-02-26 NOTE — Discharge Summary (Signed)
Physician Discharge Summary   Patient ID: Maria Rogers MRN: [id number] DOB/AGE: May 25, 1945 70 y.o.  Admit date: 02/24/2015 Discharge date: 02-26-2015  Primary Diagnosis:  Osteoarthritis Right knee(s) Admission Diagnoses:  Past Medical History  Diagnosis Date  . Hypothyroidism   . Depression   . Arthritis   . History of skin cancer   . Cancer ([place])     cancer of parotid gland / hx skin cancer   Discharge Diagnoses:   Principal Problem:   OA (osteoarthritis) of knee  Estimated body mass index is 19.77 kg/(m^2) as calculated from the following:   Height as of this encounter: '5\' 8"'$  (1.727 m).   Weight as of this encounter: 58.968 kg (130 lb).  Procedure:  Procedure(s) (LRB): TOTAL RIGHT KNEE ARTHROPLASTY (Right)   Consults: None  HPI: Maria Rogers is a 70 y.o. year old female with end stage OA of her right knee with progressively worsening pain and dysfunction. She has constant pain, with activity and at rest and significant functional deficits with difficulties even with ADLs. She has had extensive non-op management including analgesics, injections of cortisone and viscosupplements, and home exercise program, but remains in significant pain with significant dysfunction.Radiographs show bone on bone arthritis medial and patellofemoral. She presents now for right Total Knee Arthroplasty.  Laboratory Data: Admission on 02/24/2015  Component Date Value Ref Range Status  . WBC 02/25/2015 6.7  4.0 - 10.5 K/uL Final  . RBC 02/25/2015 3.20* 3.87 - 5.11 MIL/uL Final  . Hemoglobin 02/25/2015 9.8* 12.0 - 15.0 g/dL Final  . HCT 02/25/2015 30.4* 36.0 - 46.0 % Final  . MCV 02/25/2015 95.0  78.0 - 100.0 fL Final  . MCH 02/25/2015 30.6  26.0 - 34.0 pg Final  . MCHC 02/25/2015 32.2  30.0 - 36.0 g/dL Final  . RDW 02/25/2015 14.0  11.5 - 15.5 % Final  . Platelets 02/25/2015 253  150 - 400 K/uL Final  . Sodium 02/25/2015 140  135 - 145 mmol/L Final  . Potassium 02/25/2015 4.2   3.5 - 5.1 mmol/L Final  . Chloride 02/25/2015 104  101 - 111 mmol/L Final  . CO2 02/25/2015 30  22 - 32 mmol/L Final  . Glucose, Bld 02/25/2015 126* 65 - 99 mg/dL Final  . BUN 02/25/2015 12  6 - 20 mg/dL Final  . Creatinine, Ser 02/25/2015 0.79  0.44 - 1.00 mg/dL Final  . Calcium 02/25/2015 9.0  8.9 - 10.3 mg/dL Final  . GFR calc non Af Amer 02/25/2015 >60  >60 mL/min Final  . GFR calc Af Amer 02/25/2015 >60  >60 mL/min Final   Comment: (NOTE) The eGFR has been calculated using the CKD EPI equation. This calculation has not been validated in all clinical situations. eGFR's persistently <60 mL/min signify possible Chronic Kidney Disease.   . Anion gap 02/25/2015 6  5 - 15 Final  . WBC 02/26/2015 5.8  4.0 - 10.5 K/uL Final  . RBC 02/26/2015 2.97* 3.87 - 5.11 MIL/uL Final  . Hemoglobin 02/26/2015 8.9* 12.0 - 15.0 g/dL Final  . HCT 02/26/2015 27.9* 36.0 - 46.0 % Final  . MCV 02/26/2015 93.9  78.0 - 100.0 fL Final  . MCH 02/26/2015 30.0  26.0 - 34.0 pg Final  . MCHC 02/26/2015 31.9  30.0 - 36.0 g/dL Final  . RDW 02/26/2015 14.0  11.5 - 15.5 % Final  . Platelets 02/26/2015 232  150 - 400 K/uL Final  . Sodium 02/26/2015 141  135 - 145 mmol/L Final  .  Potassium 02/26/2015 3.9  3.5 - 5.1 mmol/L Final  . Chloride 02/26/2015 104  101 - 111 mmol/L Final  . CO2 02/26/2015 30  22 - 32 mmol/L Final  . Glucose, Bld 02/26/2015 106* 65 - 99 mg/dL Final  . BUN 02/26/2015 13  6 - 20 mg/dL Final  . Creatinine, Ser 02/26/2015 0.68  0.44 - 1.00 mg/dL Final  . Calcium 02/26/2015 9.3  8.9 - 10.3 mg/dL Final  . GFR calc non Af Amer 02/26/2015 >60  >60 mL/min Final  . GFR calc Af Amer 02/26/2015 >60  >60 mL/min Final   Comment: (NOTE) The eGFR has been calculated using the CKD EPI equation. This calculation has not been validated in all clinical situations. eGFR's persistently <60 mL/min signify possible Chronic Kidney Disease.   Georgiann Hahn gap 02/26/2015 7  5 - 15 Final  Hospital Outpatient Visit on  02/18/2015  Component Date Value Ref Range Status  . MRSA, PCR 02/18/2015 NEGATIVE  NEGATIVE Final  . Staphylococcus aureus 02/18/2015 NEGATIVE  NEGATIVE Final   Comment:        The Xpert SA Assay (FDA approved for NASAL specimens in patients over 30 years of age), is one component of a comprehensive surveillance program.  Test performance has been validated by [facility] for patients greater than or equal to 11 year old. It is not intended to diagnose infection nor to guide or monitor treatment.   Marland Kitchen aPTT 02/18/2015 28  24 - 37 seconds Final  . WBC 02/18/2015 3.1* 4.0 - 10.5 K/uL Final  . RBC 02/18/2015 3.70* 3.87 - 5.11 MIL/uL Final  . Hemoglobin 02/18/2015 11.3* 12.0 - 15.0 g/dL Final  . HCT 02/18/2015 34.8* 36.0 - 46.0 % Final  . MCV 02/18/2015 94.1  78.0 - 100.0 fL Final  . MCH 02/18/2015 30.5  26.0 - 34.0 pg Final  . MCHC 02/18/2015 32.5  30.0 - 36.0 g/dL Final  . RDW 02/18/2015 13.9  11.5 - 15.5 % Final  . Platelets 02/18/2015 299  150 - 400 K/uL Final  . Sodium 02/18/2015 141  135 - 145 mmol/L Final  . Potassium 02/18/2015 4.5  3.5 - 5.1 mmol/L Final  . Chloride 02/18/2015 104  101 - 111 mmol/L Final  . CO2 02/18/2015 30  22 - 32 mmol/L Final  . Glucose, Bld 02/18/2015 99  65 - 99 mg/dL Final  . BUN 02/18/2015 17  6 - 20 mg/dL Final  . Creatinine, Ser 02/18/2015 0.83  0.44 - 1.00 mg/dL Final  . Calcium 02/18/2015 9.7  8.9 - 10.3 mg/dL Final  . Total Protein 02/18/2015 7.0  6.5 - 8.1 g/dL Final  . Albumin 02/18/2015 4.2  3.5 - 5.0 g/dL Final  . AST 02/18/2015 19  15 - 41 U/L Final  . ALT 02/18/2015 14  14 - 54 U/L Final  . Alkaline Phosphatase 02/18/2015 75  38 - 126 U/L Final  . Total Bilirubin 02/18/2015 0.3  0.3 - 1.2 mg/dL Final  . GFR calc non Af Amer 02/18/2015 >60  >60 mL/min Final  . GFR calc Af Amer 02/18/2015 >60  >60 mL/min Final   Comment: (NOTE) The eGFR has been calculated using the CKD EPI equation. This calculation has not been validated in all  clinical situations. eGFR's persistently <60 mL/min signify possible Chronic Kidney Disease.   . Anion gap 02/18/2015 7  5 - 15 Final  . Prothrombin Time 02/18/2015 13.4  11.6 - 15.2 seconds Final  . INR 02/18/2015 1.00  0.00 -  1.49 Final  . ABO/RH(D) 02/18/2015 O NEG   Final  . Antibody Screen 02/18/2015 NEG   Final  . Sample Expiration 02/18/2015 02/27/2015   Final  . Extend sample reason 02/18/2015 NO TRANSFUSIONS OR PREGNANCY IN THE PAST 3 MONTHS   Final  . Color, Urine 02/18/2015 AMBER* YELLOW Final   BIOCHEMICALS MAY BE AFFECTED BY COLOR  . APPearance 02/18/2015 CLOUDY* CLEAR Final  . Specific Gravity, Urine 02/18/2015 1.034* 1.005 - 1.030 Final  . pH 02/18/2015 5.5  5.0 - 8.0 Final  . Glucose, UA 02/18/2015 NEGATIVE  NEGATIVE mg/dL Final  . Hgb urine dipstick 02/18/2015 NEGATIVE  NEGATIVE Final  . Bilirubin Urine 02/18/2015 SMALL* NEGATIVE Final  . Ketones, ur 02/18/2015 NEGATIVE  NEGATIVE mg/dL Final  . Protein, ur 02/18/2015 30* NEGATIVE mg/dL Final  . Nitrite 02/18/2015 NEGATIVE  NEGATIVE Final  . Leukocytes, UA 02/18/2015 NEGATIVE  NEGATIVE Final  . Squamous Epithelial / LPF 02/18/2015 0-5* NONE SEEN Final  . WBC, UA 02/18/2015 0-5  0 - 5 WBC/hpf Final  . RBC / HPF 02/18/2015 0-5  0 - 5 RBC/hpf Final  . Bacteria, UA 02/18/2015 RARE* NONE SEEN Final  . Casts 02/18/2015 HYALINE CASTS* NEGATIVE Final  . Urine-Other 02/18/2015 MUCOUS PRESENT   Final  . ABO/RH(D) 02/18/2015 O NEG   Final     X-Rays:No results found.  EKG:No orders found for this or any previous visit.   Hospital Course: Maria Rogers is a 70 y.o. who was admitted to [facility]. They were brought to the operating room on 02/24/2015 and underwent Procedure(s): TOTAL RIGHT KNEE ARTHROPLASTY.  Patient tolerated the procedure well and was later transferred to the recovery room and then to the orthopaedic floor for postoperative care.  They were given PO and IV analgesics for pain control following  their surgery.  They were given 24 hours of postoperative antibiotics of  Anti-infectives    Start     Dose/Rate Route Frequency Ordered Stop   02/24/15 1830  ceFAZolin (ANCEF) IVPB 2 g/50 mL premix     2 g 100 mL/hr over 30 Minutes Intravenous Every 6 hours 02/24/15 1657 02/25/15 0055   02/24/15 1022  ceFAZolin (ANCEF) IVPB 2 g/50 mL premix     2 g 100 mL/hr over 30 Minutes Intravenous On call to O.R. 02/24/15 1022 02/24/15 1244     and started on DVT prophylaxis in the form of Xarelto.   PT and OT were ordered for total joint protocol.  Discharge planning consulted to help with postop disposition and equipment needs.  Patient had a decent night on the evening of surgery.  They started to get up OOB with therapy on day one. Hemovac drain was pulled without difficulty.  Continued to work with therapy into day two.  Dressing was changed on day two and the incision was healing well. Patient was seen in rounds and was ready to go home.  Discharge home with home health Diet - Regular diet Follow up - in 2 weeks Activity - WBAT Disposition - Home Condition Upon Discharge - Good D/C Meds - See DC Summary DVT Prophylaxis - Xarelto  Discharge Instructions    Call MD / Call 911    Complete by:  As directed   If you experience chest pain or shortness of breath, CALL 911 and be transported to the hospital emergency room.  If you develope a fever above 101 F, pus (white drainage) or increased drainage or redness at the wound, or  calf pain, call your surgeon's office.     Change dressing    Complete by:  As directed   Change dressing daily with sterile 4 x 4 inch gauze dressing and apply TED hose. Do not submerge the incision under water.     Constipation Prevention    Complete by:  As directed   Drink plenty of fluids.  Prune juice may be helpful.  You may use a stool softener, such as Colace (over the counter) 100 mg twice a day.  Use MiraLax (over the counter) for constipation as needed.      Diet general    Complete by:  As directed      Discharge instructions    Complete by:  As directed   Pick up stool softner and laxative for home use following surgery while on pain medications. Do not submerge incision under water. Please use good hand washing techniques while changing dressing each day. May shower starting three days after surgery. Please use a clean towel to pat the incision dry following showers. Continue to use ice for pain and swelling after surgery. Do not use any lotions or creams on the incision until instructed by your surgeon.  Take Xarelto for two and a half more weeks, then discontinue Xarelto. Once the patient has completed the blood thinner regimen, then take a Baby 81 mg Aspirin daily for three more weeks.  Postoperative Constipation Protocol  Constipation - defined medically as fewer than three stools per week and severe constipation as less than one stool per week.  One of the most common issues patients have following surgery is constipation.  Even if you have a regular bowel pattern at home, your normal regimen is likely to be disrupted due to multiple reasons following surgery.  Combination of anesthesia, postoperative narcotics, change in appetite and fluid intake all can affect your bowels.  In order to avoid complications following surgery, here are some recommendations in order to help you during your recovery period.  Colace (docusate) - Pick up an over-the-counter form of Colace or another stool softener and take twice a day as long as you are requiring postoperative pain medications.  Take with a full glass of water daily.  If you experience loose stools or diarrhea, hold the colace until you stool forms back up.  If your symptoms do not get better within 1 week or if they get worse, check with your doctor.  Dulcolax (bisacodyl) - Pick up over-the-counter and take as directed by the product packaging as needed to assist with the movement of your  bowels.  Take with a full glass of water.  Use this product as needed if not relieved by Colace only.   MiraLax (polyethylene glycol) - Pick up over-the-counter to have on hand.  MiraLax is a solution that will increase the amount of water in your bowels to assist with bowel movements.  Take as directed and can mix with a glass of water, juice, soda, coffee, or tea.  Take if you go more than two days without a movement. Do not use MiraLax more than once per day. Call your doctor if you are still constipated or irregular after using this medication for 7 days in a row.  If you continue to have problems with postoperative constipation, please contact the office for further assistance and recommendations.  If you experience "the worst abdominal pain ever" or develop nausea or vomiting, please contact the office immediatly for further recommendations for treatment.  Do not put a pillow under the knee. Place it under the heel.    Complete by:  As directed      Do not sit on low chairs, stoools or toilet seats, as it may be difficult to get up from low surfaces    Complete by:  As directed      Driving restrictions    Complete by:  As directed   No driving until released by the physician.     Increase activity slowly as tolerated    Complete by:  As directed      Lifting restrictions    Complete by:  As directed   No lifting until released by the physician.     Patient may shower    Complete by:  As directed   You may shower without a dressing once there is no drainage.  Do not wash over the wound.  If drainage remains, do not shower until drainage stops.     TED hose    Complete by:  As directed   Use stockings (TED hose) for 3 weeks on both leg(s).  You may remove them at night for sleeping.     Weight bearing as tolerated    Complete by:  As directed   Laterality:  right  Extremity:  Lower            Medication List    STOP taking these medications        ibuprofen 100 MG tablet    Commonly known as:  ADVIL,MOTRIN      TAKE these medications        fluticasone 50 MCG/ACT nasal spray  Commonly known as:  FLONASE  Place 2 sprays into both nostrils daily.     levothyroxine 112 MCG tablet  Commonly known as:  SYNTHROID, LEVOTHROID  Take 112 mcg by mouth daily before breakfast.     methocarbamol 500 MG tablet  Commonly known as:  ROBAXIN  Take 1 tablet (500 mg total) by mouth every 6 (six) hours as needed for muscle spasms.     oxyCODONE 5 MG immediate release tablet  Commonly known as:  Oxy IR/ROXICODONE  Take 1-2 tablets (5-10 mg total) by mouth every 3 (three) hours as needed for moderate pain or severe pain.     rivaroxaban 10 MG Tabs tablet  Commonly known as:  XARELTO  Take 1 tablet (10 mg total) by mouth daily with breakfast. Take Xarelto for two and a half more weeks, then discontinue Xarelto. Once the patient has completed the blood thinner regimen, then take a Baby 81 mg Aspirin daily for three more weeks.     sodium fluoride 1.1 % Gel dental gel  Commonly known as:  FLUORISHIELD  Instill one drop of gel per toothspace of fluoride tray. Place over teeth for 5 minutes. Remove. Spit out excess. Repeat nightly.     traMADol 50 MG tablet  Commonly known as:  ULTRAM  Take 1-2 tablets (50-100 mg total) by mouth every 6 (six) hours as needed (mild pain).     TYLENOL 325 MG tablet  Generic drug:  acetaminophen  Take 650 mg by mouth at bedtime as needed (pain).     venlafaxine XR 75 MG 24 hr capsule  Commonly known as:  EFFEXOR-XR  Take 75 mg by mouth 2 (two) times daily.           Follow-up Information    Follow up with Loanne Drilling, MD. Schedule an appointment as soon  as possible for a visit on 03/09/2015.   Specialty:  Orthopedic Surgery   Why:  Call office at [phone] to setup appointment on Tuesday 03/09/2015 with Dr. Wynelle Link.   Contact information:   [address] [place] [address] [phone]        Signed: Arlee Muslim, PA-C Orthopaedic Surgery 02/26/2015, 7:08 AM

## 2015-02-26 NOTE — Progress Notes (Signed)
   Subjective: 2 Days Post-Op Procedure(s) (LRB): TOTAL RIGHT KNEE ARTHROPLASTY (Right) Patient reports pain as moderate.   Patient seen in rounds with Dr. Wynelle Link. Patient is well, but has had some minor complaints of pain in the knee, requiring pain medications Patient is ready to go home following therapy  Objective: Vital signs in last 24 hours: Temp:  [98.2 F (36.8 C)-99.9 F (37.7 C)] 98.2 F (36.8 C) (01/13 0500) Pulse Rate:  [69-73] 73 (01/13 0500) Resp:  [16] 16 (01/13 0500) BP: (115-145)/(49-62) 144/62 mmHg (01/13 0500) SpO2:  [96 %-100 %] 98 % (01/13 0500)  Intake/Output from previous day:  Intake/Output Summary (Last 24 hours) at 02/26/15 0855 Last data filed at 02/26/15 0500  Gross per 24 hour  Intake    480 ml  Output   2050 ml  Net  -1570 ml   Labs:  Recent Labs  02/25/15 0420 02/26/15 0435  HGB 9.8* 8.9*    Recent Labs  02/25/15 0420 02/26/15 0435  WBC 6.7 5.8  RBC 3.20* 2.97*  HCT 30.4* 27.9*  PLT 253 232    Recent Labs  02/25/15 0420 02/26/15 0435  NA 140 141  K 4.2 3.9  CL 104 104  CO2 30 30  BUN 12 13  CREATININE 0.79 0.68  GLUCOSE 126* 106*  CALCIUM 9.0 9.3   No results for input(s): LABPT, INR in the last 72 hours.  EXAM: General - Patient is Alert, Appropriate and Oriented Extremity - Neurovascular intact Sensation intact distally Dorsiflexion/Plantar flexion intact Incision - clean, dry, no drainage, healing Motor Function - intact, moving foot and toes well on exam.   Assessment/Plan: 2 Days Post-Op Procedure(s) (LRB): TOTAL RIGHT KNEE ARTHROPLASTY (Right) Procedure(s) (LRB): TOTAL RIGHT KNEE ARTHROPLASTY (Right) Past Medical History  Diagnosis Date  . Hypothyroidism   . Depression   . Arthritis   . History of skin cancer   . Cancer ([place])     cancer of parotid gland / hx skin cancer   Principal Problem:   OA (osteoarthritis) of knee  Estimated body mass index is 19.77 kg/(m^2) as calculated from the  following:   Height as of this encounter: 5\' 8"  (1.727 m).   Weight as of this encounter: 58.968 kg (130 lb). Up with therapy Discharge home with home health Diet - Regular diet Follow up - in 2 weeks Activity - WBAT Disposition - Home Condition Upon Discharge - Good D/C Meds - See DC Summary DVT Prophylaxis - Xarelto  Arlee Muslim, PA-C Orthopaedic Surgery 02/26/2015, 8:55 AM

## 2015-02-26 NOTE — Discharge Instructions (Signed)
° °Dr. Frank Aluisio °Total Joint Specialist °[facility] °[address] °[place], [place] [address] °[phone] ° °TOTAL KNEE REPLACEMENT POSTOPERATIVE DIRECTIONS ° °Knee Rehabilitation, Guidelines Following Surgery  °Results after knee surgery are often greatly improved when you follow the exercise, range of motion and muscle strengthening exercises prescribed by your doctor. Safety measures are also important to protect the knee from further injury. Any time any of these exercises cause you to have increased pain or swelling in your knee joint, decrease the amount until you are comfortable again and slowly increase them. If you have problems or questions, call your caregiver or physical therapist for advice.  ° °HOME CARE INSTRUCTIONS  °Remove items at home which could result in a fall. This includes throw rugs or furniture in walking pathways.  °· ICE to the affected knee every three hours for 30 minutes at a time and then as needed for pain and swelling.  Continue to use ice on the knee for pain and swelling from surgery. You may notice swelling that will progress down to the foot and ankle.  This is normal after surgery.  Elevate the leg when you are not up walking on it.   °· Continue to use the breathing machine which will help keep your temperature down.  It is common for your temperature to cycle up and down following surgery, especially at night when you are not up moving around and exerting yourself.  The breathing machine keeps your lungs expanded and your temperature down. °· Do not place pillow under knee, focus on keeping the knee straight while resting ° °DIET °You may resume your previous home diet once your are discharged from the hospital. ° °DRESSING / WOUND CARE / SHOWERING °You may shower 3 days after surgery, but keep the wounds dry during showering.  You may use an occlusive plastic wrap (Press'n Seal for example), NO SOAKING/SUBMERGING IN THE BATHTUB.  If the  bandage gets wet, change with a clean dry gauze.  If the incision gets wet, pat the wound dry with a clean towel. °You may start showering once you are discharged home but do not submerge the incision under water. Just pat the incision dry and apply a dry gauze dressing on daily. °Change the surgical dressing daily and reapply a dry dressing each time. ° °ACTIVITY °Walk with your walker as instructed. °Use walker as long as suggested by your caregivers. °Avoid periods of inactivity such as sitting longer than an hour when not asleep. This helps prevent blood clots.  °You may resume a sexual relationship in one month or when given the OK by your doctor.  °You may return to work once you are cleared by your doctor.  °Do not drive a car for 6 weeks or until released by you surgeon.  °Do not drive while taking narcotics. ° °WEIGHT BEARING °Weight bearing as tolerated with assist device (walker, cane, etc) as directed, use it as long as suggested by your surgeon or therapist, typically at least 4-6 weeks. ° °POSTOPERATIVE CONSTIPATION PROTOCOL °Constipation - defined medically as fewer than three stools per week and severe constipation as less than one stool per week. ° °One of the most common issues patients have following surgery is constipation.  Even if you have a regular bowel pattern at home, your normal regimen is likely to be disrupted due to multiple reasons following surgery.  Combination of anesthesia, postoperative narcotics, change in appetite and fluid intake all can affect your bowels.    In order to avoid complications following surgery, here are some recommendations in order to help you during your recovery period. ° °Colace (docusate) - Pick up an over-the-counter form of Colace or another stool softener and take twice a day as long as you are requiring postoperative pain medications.  Take with a full glass of water daily.  If you experience loose stools or diarrhea, hold the colace until you stool forms  back up.  If your symptoms do not get better within 1 week or if they get worse, check with your doctor. ° °Dulcolax (bisacodyl) - Pick up over-the-counter and take as directed by the product packaging as needed to assist with the movement of your bowels.  Take with a full glass of water.  Use this product as needed if not relieved by Colace only.  ° °MiraLax (polyethylene glycol) - Pick up over-the-counter to have on hand.  MiraLax is a solution that will increase the amount of water in your bowels to assist with bowel movements.  Take as directed and can mix with a glass of water, juice, soda, coffee, or tea.  Take if you go more than two days without a movement. °Do not use MiraLax more than once per day. Call your doctor if you are still constipated or irregular after using this medication for 7 days in a row. ° °If you continue to have problems with postoperative constipation, please contact the office for further assistance and recommendations.  If you experience "the worst abdominal pain ever" or develop nausea or vomiting, please contact the office immediatly for further recommendations for treatment. ° °ITCHING ° If you experience itching with your medications, try taking only a single pain pill, or even half a pain pill at a time.  You can also use Benadryl over the counter for itching or also to help with sleep.  ° °TED HOSE STOCKINGS °Wear the elastic stockings on both legs for three weeks following surgery during the day but you may remove then at night for sleeping. ° °MEDICATIONS °See your medication summary on the “After Visit Summary” that the nursing staff will review with you prior to discharge.  You may have some home medications which will be placed on hold until you complete the course of blood thinner medication.  It is important for you to complete the blood thinner medication as prescribed by your surgeon.  Continue your approved medications as instructed at time of  discharge. ° °PRECAUTIONS °If you experience chest pain or shortness of breath - call 911 immediately for transfer to the hospital emergency department.  °If you develop a fever greater that 101 F, purulent drainage from wound, increased redness or drainage from wound, foul odor from the wound/dressing, or calf pain - CONTACT YOUR SURGEON.   °                                                °FOLLOW-UP APPOINTMENTS °Make sure you keep all of your appointments after your operation with your surgeon and caregivers. You should call the office at the above phone number and make an appointment for approximately two weeks after the date of your surgery or on the date instructed by your surgeon outlined in the "After Visit Summary". ° ° °RANGE OF MOTION AND STRENGTHENING EXERCISES  °Rehabilitation of the knee is important following a knee injury or   an operation. After just a few days of immobilization, the muscles of the thigh which control the knee become weakened and shrink (atrophy). Knee exercises are designed to build up the tone and strength of the thigh muscles and to improve knee motion. Often times heat used for twenty to thirty minutes before working out will loosen up your tissues and help with improving the range of motion but do not use heat for the first two weeks following surgery. These exercises can be done on a training (exercise) mat, on the floor, on a table or on a bed. Use what ever works the best and is most comfortable for you Knee exercises include:  °Leg Lifts - While your knee is still immobilized in a splint or cast, you can do straight leg raises. Lift the leg to 60 degrees, hold for 3 sec, and slowly lower the leg. Repeat 10-20 times 2-3 times daily. Perform this exercise against resistance later as your knee gets better.  °Quad and Hamstring Sets - Tighten up the muscle on the front of the thigh (Quad) and hold for 5-10 sec. Repeat this 10-20 times hourly. Hamstring sets are done by pushing the  foot backward against an object and holding for 5-10 sec. Repeat as with quad sets.  °· Leg Slides: Lying on your back, slowly slide your foot toward your buttocks, bending your knee up off the floor (only go as far as is comfortable). Then slowly slide your foot back down until your leg is flat on the floor again. °· Angel Wings: Lying on your back spread your legs to the side as far apart as you can without causing discomfort.  °A rehabilitation program following serious knee injuries can speed recovery and prevent re-injury in the future due to weakened muscles. Contact your doctor or a physical therapist for more information on knee rehabilitation.  ° °IF YOU ARE TRANSFERRED TO A SKILLED REHAB FACILITY °If the patient is transferred to a skilled rehab facility following release from the hospital, a list of the current medications will be sent to the facility for the patient to continue.  When discharged from the skilled rehab facility, please have the facility set up the patient's Home Health Physical Therapy prior to being released. Also, the skilled facility will be responsible for providing the patient with their medications at time of release from the facility to include their pain medication, the muscle relaxants, and their blood thinner medication. If the patient is still at the rehab facility at time of the two week follow up appointment, the skilled rehab facility will also need to assist the patient in arranging follow up appointment in our office and any transportation needs. ° °MAKE SURE YOU:  °Understand these instructions.  °Get help right away if you are not doing well or get worse.  ° ° °Pick up stool softner and laxative for home use following surgery while on pain medications. °Do not submerge incision under water. °Please use good hand washing techniques while changing dressing each day. °May shower starting three days after surgery. °Please use a clean towel to pat the incision dry following  showers. °Continue to use ice for pain and swelling after surgery. °Do not use any lotions or creams on the incision until instructed by your surgeon. ° °Take Xarelto for two and a half more weeks, then discontinue Xarelto. °Once the patient has completed the blood thinner regimen, then take a Baby 81 mg Aspirin daily for three more weeks. ° °

## 2015-03-12 ENCOUNTER — Encounter: Payer: Self-pay | Admitting: Dietician

## 2015-03-12 NOTE — Progress Notes (Signed)
Received referral from Rad. Onc MD. Pt was last seen at [facility] on 02/03/15  Contacted Pt by phone  Got answering machine. Left voicemail w/ call back information. Will re-attempt conversation next week.   Burtis Junes RD, LDN Nutrition Pager: [phone] 03/12/2015 3:18 PM

## 2015-03-17 ENCOUNTER — Encounter: Payer: Self-pay | Admitting: Dietician

## 2015-03-17 NOTE — Progress Notes (Signed)
Called pt again. Pt was referral from [place] MD for wt loss/taste changes  Pt was last seen at [facility] on 02/03/15  Contacted Pt again by phone  Got answering machine. Left voicemail.  Will try one more time at end of week to reach pt.  Burtis Junes RD, LDN Nutrition Pager: [phone] 03/17/2015 11:43 AM

## 2015-03-19 ENCOUNTER — Encounter: Payer: Self-pay | Admitting: Dietician

## 2015-03-19 NOTE — Progress Notes (Signed)
Attempted to reach pt again as she was referred to dietitian for wt loss/taste changes  Pt last seen at [facility] on 02/03/15  Contacted by phone again. Tried both listed home and mobile phone number and again only got voicemail.   Left message.   Will send handout on taste  contact info, coupons, and handouts titled "Taste and Smell Changes"  Have tried home phone 3x and mobile x1. Left message each time. Will cease trying to contact at this point.   Burtis Junes RD, LDN Nutrition Pager: [phone] 03/19/2015 10:51 AM

## 2015-06-04 ENCOUNTER — Ambulatory Visit (HOSPITAL_COMMUNITY): Payer: Medicare Other

## 2015-06-09 ENCOUNTER — Ambulatory Visit (HOSPITAL_COMMUNITY): Payer: Self-pay | Admitting: Hematology & Oncology

## 2015-06-09 NOTE — Progress Notes (Signed)
This encounter was created in error - please disregard.

## 2015-08-13 ENCOUNTER — Ambulatory Visit (HOSPITAL_COMMUNITY): Payer: Self-pay | Admitting: Hematology & Oncology

## 2015-09-13 ENCOUNTER — Ambulatory Visit (INDEPENDENT_AMBULATORY_CARE_PROVIDER_SITE_OTHER): Payer: Self-pay | Admitting: Neurology

## 2015-09-13 ENCOUNTER — Ambulatory Visit (INDEPENDENT_AMBULATORY_CARE_PROVIDER_SITE_OTHER): Payer: Medicare Other | Admitting: Neurology

## 2015-09-13 ENCOUNTER — Encounter: Payer: Self-pay | Admitting: Neurology

## 2015-09-13 DIAGNOSIS — G5601 Carpal tunnel syndrome, right upper limb: Secondary | ICD-10-CM | POA: Diagnosis not present

## 2015-09-13 DIAGNOSIS — G5603 Carpal tunnel syndrome, bilateral upper limbs: Secondary | ICD-10-CM

## 2015-09-13 DIAGNOSIS — G5602 Carpal tunnel syndrome, left upper limb: Secondary | ICD-10-CM | POA: Diagnosis not present

## 2015-09-13 HISTORY — DX: Carpal tunnel syndrome, bilateral upper limbs: G56.03

## 2015-09-13 NOTE — Procedures (Signed)
     HISTORY:  Maria Rogers is a 70 year old patient with a history of paresthesias and numbness in both hands, left greater than right, since May 2017. The patient denies any pain radiating from the neck or shoulders down the arms on either side. She denies any significant weakness of the arms. The patient is being evaluated for a neuropathy versus a cervical radiculopathy.  NERVE CONDUCTION STUDIES:  Nerve conduction studies were performed on both upper extremities. The distal motor latencies for the median nerves were prolonged bilaterally with normal motor amplitudes for these nerves bilaterally. The distal motor latencies and motor amplitudes for the ulnar nerves were normal bilaterally. The F wave latencies for the median nerves were prolonged bilaterally, normal for the ulnar nerves bilaterally. The nerve conduction velocities for the median and ulnar nerves were normal bilaterally. The sensory latencies for the median nerves were prolonged on the right, absent on the left, and normal for the ulnar nerves bilaterally.  EMG STUDIES:  EMG study was performed on the left upper extremity:  The first dorsal interosseous muscle reveals 2 to 4 K units with full recruitment. No fibrillations or positive waves were noted. The abductor pollicis brevis muscle reveals 2 to 4 K units with slightly decreased recruitment. No fibrillations or positive waves were noted. The extensor indicis proprius muscle reveals 1 to 3 K units with full recruitment. No fibrillations or positive waves were noted. The pronator teres muscle reveals 2 to 3 K units with full recruitment. No fibrillations or positive waves were noted. The biceps muscle reveals 1 to 2 K units with full recruitment. No fibrillations or positive waves were noted. The triceps muscle reveals 2 to 4 K units with full recruitment. No fibrillations or positive waves were noted. The anterior deltoid muscle reveals 2 to 3 K units with full  recruitment. No fibrillations or positive waves were noted. The cervical paraspinal muscles were tested at 2 levels. No abnormalities of insertional activity were seen at either level tested. There was good relaxation.  A limited EMG study was performed on the right upper extremity:  The first dorsal interosseous muscle reveals 2 to 4 K units with full recruitment. No fibrillations or positive waves were noted. The abductor pollicis brevis muscle reveals 2 to 4 K units with full recruitment. No fibrillations or positive waves were noted. The extensor indicis proprius muscle reveals 1 to 3 K units with full recruitment. No fibrillations or positive waves were noted.   IMPRESSION:  Nerve conduction studies done on both upper extremities shows evidence of bilateral carpal tunnel syndrome of mild to moderate severity. EMG evaluation of the left upper extremity was unremarkable, without evidence of an overlying cervical radiculopathy. A limited EMG evaluation of the right upper extremity was unremarkable.  Jill Alexanders MD 09/13/2015 11:19 AM  [facility] [address] [place],  [address]  Phone [phone] Fax [phone]

## 2015-09-13 NOTE — Progress Notes (Signed)
Please refer to EMG and nerve conduction study procedure note. 

## 2015-09-16 ENCOUNTER — Ambulatory Visit (INDEPENDENT_AMBULATORY_CARE_PROVIDER_SITE_OTHER): Payer: Medicare Other | Admitting: Otolaryngology

## 2015-09-16 DIAGNOSIS — H903 Sensorineural hearing loss, bilateral: Secondary | ICD-10-CM

## 2015-09-16 DIAGNOSIS — H906 Mixed conductive and sensorineural hearing loss, bilateral: Secondary | ICD-10-CM

## 2015-09-16 DIAGNOSIS — H6121 Impacted cerumen, right ear: Secondary | ICD-10-CM

## 2015-09-24 NOTE — Progress Notes (Signed)
This encounter was created in error - please disregard.

## 2015-09-30 ENCOUNTER — Ambulatory Visit (INDEPENDENT_AMBULATORY_CARE_PROVIDER_SITE_OTHER): Payer: Medicare Other | Admitting: Otolaryngology

## 2015-09-30 DIAGNOSIS — H6121 Impacted cerumen, right ear: Secondary | ICD-10-CM

## 2015-12-21 ENCOUNTER — Other Ambulatory Visit: Payer: Self-pay | Admitting: Dermatology

## 2016-04-06 ENCOUNTER — Ambulatory Visit (INDEPENDENT_AMBULATORY_CARE_PROVIDER_SITE_OTHER): Payer: Self-pay | Admitting: Otolaryngology

## 2016-04-20 ENCOUNTER — Other Ambulatory Visit: Payer: Self-pay | Admitting: Dermatology

## 2016-10-30 ENCOUNTER — Other Ambulatory Visit: Payer: Self-pay | Admitting: Dermatology

## 2017-01-08 ENCOUNTER — Ambulatory Visit (INDEPENDENT_AMBULATORY_CARE_PROVIDER_SITE_OTHER): Payer: Medicare Other | Admitting: Otolaryngology

## 2017-01-08 DIAGNOSIS — H6121 Impacted cerumen, right ear: Secondary | ICD-10-CM | POA: Diagnosis not present

## 2017-01-08 DIAGNOSIS — H9011 Conductive hearing loss, unilateral, right ear, with unrestricted hearing on the contralateral side: Secondary | ICD-10-CM

## 2017-02-01 ENCOUNTER — Ambulatory Visit (INDEPENDENT_AMBULATORY_CARE_PROVIDER_SITE_OTHER): Payer: Medicare Other | Admitting: Otolaryngology

## 2017-02-01 DIAGNOSIS — R590 Localized enlarged lymph nodes: Secondary | ICD-10-CM | POA: Diagnosis not present

## 2017-02-01 DIAGNOSIS — H6121 Impacted cerumen, right ear: Secondary | ICD-10-CM | POA: Diagnosis not present

## 2017-02-01 DIAGNOSIS — H903 Sensorineural hearing loss, bilateral: Secondary | ICD-10-CM | POA: Diagnosis not present

## 2017-05-01 ENCOUNTER — Other Ambulatory Visit: Payer: Self-pay | Admitting: Dermatology

## 2017-05-03 ENCOUNTER — Ambulatory Visit (INDEPENDENT_AMBULATORY_CARE_PROVIDER_SITE_OTHER): Payer: Medicare Other | Admitting: Otolaryngology

## 2017-05-03 DIAGNOSIS — H6121 Impacted cerumen, right ear: Secondary | ICD-10-CM | POA: Diagnosis not present

## 2017-10-29 ENCOUNTER — Ambulatory Visit (INDEPENDENT_AMBULATORY_CARE_PROVIDER_SITE_OTHER): Payer: Self-pay | Admitting: Otolaryngology

## 2018-01-01 ENCOUNTER — Other Ambulatory Visit: Payer: Self-pay | Admitting: Dermatology

## 2018-01-17 ENCOUNTER — Ambulatory Visit (INDEPENDENT_AMBULATORY_CARE_PROVIDER_SITE_OTHER): Payer: Medicare Other | Admitting: Otolaryngology

## 2018-01-17 DIAGNOSIS — H6523 Chronic serous otitis media, bilateral: Secondary | ICD-10-CM

## 2018-01-17 DIAGNOSIS — J310 Chronic rhinitis: Secondary | ICD-10-CM

## 2018-02-07 ENCOUNTER — Ambulatory Visit (INDEPENDENT_AMBULATORY_CARE_PROVIDER_SITE_OTHER): Payer: Medicare Other | Admitting: Otolaryngology

## 2018-02-07 DIAGNOSIS — H6121 Impacted cerumen, right ear: Secondary | ICD-10-CM

## 2018-02-21 ENCOUNTER — Other Ambulatory Visit: Payer: Self-pay | Admitting: Dermatology

## 2018-08-15 ENCOUNTER — Ambulatory Visit (INDEPENDENT_AMBULATORY_CARE_PROVIDER_SITE_OTHER): Payer: Medicare Other | Admitting: Otolaryngology

## 2018-08-15 DIAGNOSIS — H6121 Impacted cerumen, right ear: Secondary | ICD-10-CM

## 2018-08-15 DIAGNOSIS — H903 Sensorineural hearing loss, bilateral: Secondary | ICD-10-CM | POA: Diagnosis not present

## 2018-09-02 ENCOUNTER — Encounter (INDEPENDENT_AMBULATORY_CARE_PROVIDER_SITE_OTHER): Payer: Medicare Other | Admitting: Ophthalmology

## 2018-09-02 ENCOUNTER — Other Ambulatory Visit: Payer: Self-pay

## 2018-09-02 DIAGNOSIS — H59033 Cystoid macular edema following cataract surgery, bilateral: Secondary | ICD-10-CM

## 2018-09-02 DIAGNOSIS — H43813 Vitreous degeneration, bilateral: Secondary | ICD-10-CM | POA: Diagnosis not present

## 2018-10-01 ENCOUNTER — Other Ambulatory Visit: Payer: Self-pay | Admitting: Dermatology

## 2018-10-14 ENCOUNTER — Encounter (INDEPENDENT_AMBULATORY_CARE_PROVIDER_SITE_OTHER): Payer: Medicare Other | Admitting: Ophthalmology

## 2018-10-14 ENCOUNTER — Other Ambulatory Visit: Payer: Self-pay

## 2018-10-14 DIAGNOSIS — H35033 Hypertensive retinopathy, bilateral: Secondary | ICD-10-CM | POA: Diagnosis not present

## 2018-10-14 DIAGNOSIS — H353131 Nonexudative age-related macular degeneration, bilateral, early dry stage: Secondary | ICD-10-CM

## 2018-10-14 DIAGNOSIS — I10 Essential (primary) hypertension: Secondary | ICD-10-CM

## 2018-10-14 DIAGNOSIS — H59033 Cystoid macular edema following cataract surgery, bilateral: Secondary | ICD-10-CM

## 2018-10-14 DIAGNOSIS — H43813 Vitreous degeneration, bilateral: Secondary | ICD-10-CM

## 2018-12-17 ENCOUNTER — Other Ambulatory Visit: Payer: Self-pay

## 2018-12-17 ENCOUNTER — Encounter (INDEPENDENT_AMBULATORY_CARE_PROVIDER_SITE_OTHER): Payer: Medicare Other | Admitting: Ophthalmology

## 2018-12-17 DIAGNOSIS — H59033 Cystoid macular edema following cataract surgery, bilateral: Secondary | ICD-10-CM

## 2018-12-17 DIAGNOSIS — H43813 Vitreous degeneration, bilateral: Secondary | ICD-10-CM | POA: Diagnosis not present

## 2018-12-17 DIAGNOSIS — H353131 Nonexudative age-related macular degeneration, bilateral, early dry stage: Secondary | ICD-10-CM | POA: Diagnosis not present

## 2019-02-17 DIAGNOSIS — H6123 Impacted cerumen, bilateral: Secondary | ICD-10-CM | POA: Diagnosis not present

## 2019-02-17 DIAGNOSIS — R42 Dizziness and giddiness: Secondary | ICD-10-CM | POA: Diagnosis not present

## 2019-02-17 DIAGNOSIS — H903 Sensorineural hearing loss, bilateral: Secondary | ICD-10-CM | POA: Diagnosis not present

## 2019-02-20 DIAGNOSIS — R2689 Other abnormalities of gait and mobility: Secondary | ICD-10-CM | POA: Diagnosis not present

## 2019-02-27 DIAGNOSIS — R2689 Other abnormalities of gait and mobility: Secondary | ICD-10-CM | POA: Diagnosis not present

## 2019-03-06 DIAGNOSIS — H04123 Dry eye syndrome of bilateral lacrimal glands: Secondary | ICD-10-CM | POA: Diagnosis not present

## 2019-03-06 DIAGNOSIS — H16223 Keratoconjunctivitis sicca, not specified as Sjogren's, bilateral: Secondary | ICD-10-CM | POA: Diagnosis not present

## 2019-03-17 DIAGNOSIS — R2689 Other abnormalities of gait and mobility: Secondary | ICD-10-CM | POA: Diagnosis not present

## 2019-03-19 DIAGNOSIS — Z0001 Encounter for general adult medical examination with abnormal findings: Secondary | ICD-10-CM | POA: Diagnosis not present

## 2019-03-19 DIAGNOSIS — E782 Mixed hyperlipidemia: Secondary | ICD-10-CM | POA: Diagnosis not present

## 2019-03-24 DIAGNOSIS — E782 Mixed hyperlipidemia: Secondary | ICD-10-CM | POA: Diagnosis not present

## 2019-03-24 DIAGNOSIS — E039 Hypothyroidism, unspecified: Secondary | ICD-10-CM | POA: Diagnosis not present

## 2019-03-24 DIAGNOSIS — M1711 Unilateral primary osteoarthritis, right knee: Secondary | ICD-10-CM | POA: Diagnosis not present

## 2019-03-24 DIAGNOSIS — L820 Inflamed seborrheic keratosis: Secondary | ICD-10-CM | POA: Diagnosis not present

## 2019-03-24 DIAGNOSIS — Z6827 Body mass index (BMI) 27.0-27.9, adult: Secondary | ICD-10-CM | POA: Diagnosis not present

## 2019-03-24 DIAGNOSIS — Z0001 Encounter for general adult medical examination with abnormal findings: Secondary | ICD-10-CM | POA: Diagnosis not present

## 2019-03-24 DIAGNOSIS — F331 Major depressive disorder, recurrent, moderate: Secondary | ICD-10-CM | POA: Diagnosis not present

## 2019-03-25 DIAGNOSIS — H04123 Dry eye syndrome of bilateral lacrimal glands: Secondary | ICD-10-CM | POA: Diagnosis not present

## 2019-03-25 DIAGNOSIS — R2689 Other abnormalities of gait and mobility: Secondary | ICD-10-CM | POA: Diagnosis not present

## 2019-04-01 DIAGNOSIS — R2689 Other abnormalities of gait and mobility: Secondary | ICD-10-CM | POA: Diagnosis not present

## 2019-05-13 DIAGNOSIS — H353131 Nonexudative age-related macular degeneration, bilateral, early dry stage: Secondary | ICD-10-CM | POA: Diagnosis not present

## 2019-05-20 ENCOUNTER — Encounter (INDEPENDENT_AMBULATORY_CARE_PROVIDER_SITE_OTHER): Payer: Medicare PPO | Admitting: Ophthalmology

## 2019-05-20 DIAGNOSIS — H59032 Cystoid macular edema following cataract surgery, left eye: Secondary | ICD-10-CM

## 2019-05-20 DIAGNOSIS — H353131 Nonexudative age-related macular degeneration, bilateral, early dry stage: Secondary | ICD-10-CM

## 2019-05-20 DIAGNOSIS — H43813 Vitreous degeneration, bilateral: Secondary | ICD-10-CM | POA: Diagnosis not present

## 2019-05-27 ENCOUNTER — Encounter: Payer: Self-pay | Admitting: Dermatology

## 2019-05-27 ENCOUNTER — Ambulatory Visit: Payer: Medicare PPO | Admitting: Dermatology

## 2019-05-27 ENCOUNTER — Other Ambulatory Visit: Payer: Self-pay

## 2019-05-27 DIAGNOSIS — D099 Carcinoma in situ, unspecified: Secondary | ICD-10-CM | POA: Diagnosis not present

## 2019-05-27 DIAGNOSIS — L570 Actinic keratosis: Secondary | ICD-10-CM

## 2019-05-27 DIAGNOSIS — D485 Neoplasm of uncertain behavior of skin: Secondary | ICD-10-CM

## 2019-05-27 DIAGNOSIS — D0439 Carcinoma in situ of skin of other parts of face: Secondary | ICD-10-CM | POA: Diagnosis not present

## 2019-05-27 DIAGNOSIS — L820 Inflamed seborrheic keratosis: Secondary | ICD-10-CM | POA: Diagnosis not present

## 2019-05-27 DIAGNOSIS — D492 Neoplasm of unspecified behavior of bone, soft tissue, and skin: Secondary | ICD-10-CM

## 2019-05-27 MED ORDER — IMIQUIMOD 5 % EX CREA: TOPICAL_CREAM | Freq: Every evening | CUTANEOUS | 0 refills | Status: DC

## 2019-05-27 NOTE — Patient Instructions (Addendum)
Biopsy, Surgery (Curettage) & Surgery (Excision) Aftercare Instructions  1. Okay to remove bandage in 24 hours  2. Wash area with soap and water  3. Apply Vaseline to area twice daily until healed (Not Neosporin)  4. Okay to cover with a Band-Aid to decrease the chance of infection or prevent irritation from clothing; also it's okay to uncover lesion at home.  5. Suture instructions: return to our office in 7-10 or 10-14 days for a nurse visit for suture removal. Variable healing with sutures, if pain or itching occurs call our office. It's okay to shower or bathe 24 hours after sutures are given.  6. The following risks may occur after a biopsy, curettage or excision: bleeding, scarring, discoloration, recurrence, infection (redness, yellow drainage, pain or swelling).  7. For questions, concerns and results call our office at [address] before 4pm & Friday before 3pm. Biopsy results will be available in 1 week.  Several skin problems check today.  The superficial cancers on Maria Rogers's back are all smooth although there is some residual redness.  We will simply keep an eye on these.  The site of a carcinoma in situ on the left cheek shows new pink crusting.  Repeat biopsy obtained and base curetted.  We may elect to use imiquimod on this in the future.  The biopsy site on the right lower lip which showed an actinic keratosis shows recurrent red flat crust.  This was treated with a 5-second liquid nitrogen freeze which will be followed with imiquimod which Maria Rogers is only to applied to the right lower lip twice weekly Monday and Friday not 3 times weekly.  Multiple keratoses on sun exposed areas were treated with liquid nitrogen freeze except for 1 below the left neck which was thicker which was shave biopsied.  I have asked [name] to give my office call in 2 to 3 days so we can review the biopsies with her.  Routine recheck in 10 weeks.

## 2019-05-29 ENCOUNTER — Telehealth: Payer: Self-pay | Admitting: *Deleted

## 2019-05-29 ENCOUNTER — Telehealth: Payer: Self-pay

## 2019-05-29 NOTE — Telephone Encounter (Signed)
-----   Message from Lavonna Monarch, MD sent at 05/29/2019  6:56 AM EDT ----- Inform as we discussed #2=superficial low risk non-melanoma cancer, base treated with biopsy. Wait 2 months, IF any residual crust do 3x/week imiquimod for 6 weeks.

## 2019-05-29 NOTE — Telephone Encounter (Signed)
Patient aware of the results and she read me her note from my chart to confirm how to treat the lip.

## 2019-05-29 NOTE — Telephone Encounter (Signed)
LEFT MESSAGE FOR PATIENT TO CALL us BACK TO GET PATHOLOGY RESULTS.

## 2019-05-31 ENCOUNTER — Encounter: Payer: Self-pay | Admitting: Dermatology

## 2019-05-31 NOTE — Progress Notes (Signed)
   Follow-Up Visit   Subjective  Maria Rogers is a 74 y.o. female who presents for the following: Follow-up (4 month f/u- back x 3 spots- treated with Imiquimod cream- pt had a good reaction, left cheek(treated CX35FU 10/2018 and lower lip(LN2 10/2018)- stays scaly).  New crust Location: Left cheek Duration: 3 months Quality: Low growth Associated Signs/Symptoms: Sensitive Modifying Factors:  Severity:  Timing: Context: Site of previous CIS  The following portions of the chart were reviewed this encounter and updated as appropriate: Tobacco  Allergies  Meds  Problems  Med Hx  Surg Hx  Fam Hx      Objective  Well appearing patient in no apparent distress; mood and affect are within normal limits.  All sun exposed skin examined head, face, ears, lips, neck, arms, hands. Several skin problems check today.  The superficial cancers on Maria Rogers's back are all smooth although there is some residual redness.  We will simply keep an eye on these.  The site of a carcinoma in situ on the left cheek shows new pink crusting.  Repeat biopsy obtained and base curetted.  We may elect to use a medical mode on this in the future.  The biopsy site on the right lower lip which showed an actinic keratosis shows recurrent red flat crust.  This was treated with a 5-second liquid nitrogen freeze which will be followed with medical mode which Maria Rogers is only 2 applied to the right lower lip twice weekly Monday and Friday not 3 times weekly.  Multiple keratoses on sun exposed areas were treated with liquid nitrogen freeze except for 1 below the left neck which was thicker which was shave biopsied.  I have asked Maria Rogers to give my office call in 2 to 3 days so we can review the biopsies with her.  Routine recheck in 10 weeks.  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

## 2019-06-10 ENCOUNTER — Telehealth: Payer: Self-pay | Admitting: Dermatology

## 2019-06-10 NOTE — Telephone Encounter (Signed)
Patient called requesting HCFA form with the  CPT codes used during her 06-24-2019 visit with Dr Denna Haggard. After contacting [facility] billing department I was told they don't print off HCFA forms for patients. I printed out the information under her 06-24-19 visit with the CPT codes and sent them to the patients home address per her request.

## 2019-07-01 ENCOUNTER — Encounter (INDEPENDENT_AMBULATORY_CARE_PROVIDER_SITE_OTHER): Payer: Medicare PPO | Admitting: Ophthalmology

## 2019-07-01 ENCOUNTER — Other Ambulatory Visit: Payer: Self-pay

## 2019-07-01 DIAGNOSIS — H353131 Nonexudative age-related macular degeneration, bilateral, early dry stage: Secondary | ICD-10-CM

## 2019-07-01 DIAGNOSIS — H43813 Vitreous degeneration, bilateral: Secondary | ICD-10-CM

## 2019-07-01 DIAGNOSIS — H59032 Cystoid macular edema following cataract surgery, left eye: Secondary | ICD-10-CM | POA: Diagnosis not present

## 2019-07-02 DIAGNOSIS — C07 Malignant neoplasm of parotid gland: Secondary | ICD-10-CM | POA: Diagnosis not present

## 2019-08-19 DIAGNOSIS — H903 Sensorineural hearing loss, bilateral: Secondary | ICD-10-CM | POA: Diagnosis not present

## 2019-08-19 DIAGNOSIS — D487 Neoplasm of uncertain behavior of other specified sites: Secondary | ICD-10-CM | POA: Diagnosis not present

## 2019-08-19 DIAGNOSIS — H6123 Impacted cerumen, bilateral: Secondary | ICD-10-CM | POA: Diagnosis not present

## 2019-09-15 DIAGNOSIS — E782 Mixed hyperlipidemia: Secondary | ICD-10-CM | POA: Diagnosis not present

## 2019-09-15 DIAGNOSIS — E039 Hypothyroidism, unspecified: Secondary | ICD-10-CM | POA: Diagnosis not present

## 2019-09-18 DIAGNOSIS — E039 Hypothyroidism, unspecified: Secondary | ICD-10-CM | POA: Diagnosis not present

## 2019-09-18 DIAGNOSIS — M1711 Unilateral primary osteoarthritis, right knee: Secondary | ICD-10-CM | POA: Diagnosis not present

## 2019-09-18 DIAGNOSIS — Z6827 Body mass index (BMI) 27.0-27.9, adult: Secondary | ICD-10-CM | POA: Diagnosis not present

## 2019-09-18 DIAGNOSIS — F331 Major depressive disorder, recurrent, moderate: Secondary | ICD-10-CM | POA: Diagnosis not present

## 2019-09-18 DIAGNOSIS — E782 Mixed hyperlipidemia: Secondary | ICD-10-CM | POA: Diagnosis not present

## 2019-09-18 DIAGNOSIS — L820 Inflamed seborrheic keratosis: Secondary | ICD-10-CM | POA: Diagnosis not present

## 2019-10-01 ENCOUNTER — Other Ambulatory Visit: Payer: Self-pay

## 2019-10-01 ENCOUNTER — Encounter (INDEPENDENT_AMBULATORY_CARE_PROVIDER_SITE_OTHER): Payer: Medicare PPO | Admitting: Ophthalmology

## 2019-10-01 DIAGNOSIS — H59032 Cystoid macular edema following cataract surgery, left eye: Secondary | ICD-10-CM

## 2019-10-01 DIAGNOSIS — H353112 Nonexudative age-related macular degeneration, right eye, intermediate dry stage: Secondary | ICD-10-CM | POA: Diagnosis not present

## 2019-10-01 DIAGNOSIS — H43813 Vitreous degeneration, bilateral: Secondary | ICD-10-CM

## 2019-10-01 DIAGNOSIS — H353121 Nonexudative age-related macular degeneration, left eye, early dry stage: Secondary | ICD-10-CM | POA: Diagnosis not present

## 2019-11-18 DIAGNOSIS — H903 Sensorineural hearing loss, bilateral: Secondary | ICD-10-CM | POA: Diagnosis not present

## 2019-11-18 DIAGNOSIS — Z85858 Personal history of malignant neoplasm of other endocrine glands: Secondary | ICD-10-CM | POA: Diagnosis not present

## 2020-01-07 DIAGNOSIS — R300 Dysuria: Secondary | ICD-10-CM | POA: Diagnosis not present

## 2020-01-07 DIAGNOSIS — Z6827 Body mass index (BMI) 27.0-27.9, adult: Secondary | ICD-10-CM | POA: Diagnosis not present

## 2020-01-20 DIAGNOSIS — I10 Essential (primary) hypertension: Secondary | ICD-10-CM | POA: Diagnosis not present

## 2020-01-20 DIAGNOSIS — Z23 Encounter for immunization: Secondary | ICD-10-CM | POA: Diagnosis not present

## 2020-03-24 DIAGNOSIS — E782 Mixed hyperlipidemia: Secondary | ICD-10-CM | POA: Diagnosis not present

## 2020-03-24 DIAGNOSIS — I10 Essential (primary) hypertension: Secondary | ICD-10-CM | POA: Diagnosis not present

## 2020-03-24 DIAGNOSIS — E7849 Other hyperlipidemia: Secondary | ICD-10-CM | POA: Diagnosis not present

## 2020-03-24 DIAGNOSIS — E039 Hypothyroidism, unspecified: Secondary | ICD-10-CM | POA: Diagnosis not present

## 2020-03-26 DIAGNOSIS — M1711 Unilateral primary osteoarthritis, right knee: Secondary | ICD-10-CM | POA: Diagnosis not present

## 2020-03-26 DIAGNOSIS — L820 Inflamed seborrheic keratosis: Secondary | ICD-10-CM | POA: Diagnosis not present

## 2020-03-26 DIAGNOSIS — Z0001 Encounter for general adult medical examination with abnormal findings: Secondary | ICD-10-CM | POA: Diagnosis not present

## 2020-03-26 DIAGNOSIS — Z6827 Body mass index (BMI) 27.0-27.9, adult: Secondary | ICD-10-CM | POA: Diagnosis not present

## 2020-03-26 DIAGNOSIS — E039 Hypothyroidism, unspecified: Secondary | ICD-10-CM | POA: Diagnosis not present

## 2020-03-26 DIAGNOSIS — B351 Tinea unguium: Secondary | ICD-10-CM | POA: Diagnosis not present

## 2020-03-26 DIAGNOSIS — Z23 Encounter for immunization: Secondary | ICD-10-CM | POA: Diagnosis not present

## 2020-03-26 DIAGNOSIS — E7849 Other hyperlipidemia: Secondary | ICD-10-CM | POA: Diagnosis not present

## 2020-05-04 DIAGNOSIS — M85851 Other specified disorders of bone density and structure, right thigh: Secondary | ICD-10-CM | POA: Diagnosis not present

## 2020-05-04 DIAGNOSIS — Z780 Asymptomatic menopausal state: Secondary | ICD-10-CM | POA: Diagnosis not present

## 2020-05-04 DIAGNOSIS — M810 Age-related osteoporosis without current pathological fracture: Secondary | ICD-10-CM | POA: Diagnosis not present

## 2020-05-11 DIAGNOSIS — H353111 Nonexudative age-related macular degeneration, right eye, early dry stage: Secondary | ICD-10-CM | POA: Diagnosis not present

## 2020-05-18 DIAGNOSIS — Z85858 Personal history of malignant neoplasm of other endocrine glands: Secondary | ICD-10-CM | POA: Diagnosis not present

## 2020-05-18 DIAGNOSIS — H903 Sensorineural hearing loss, bilateral: Secondary | ICD-10-CM | POA: Diagnosis not present

## 2020-05-18 DIAGNOSIS — H6123 Impacted cerumen, bilateral: Secondary | ICD-10-CM | POA: Diagnosis not present

## 2020-06-10 DIAGNOSIS — H903 Sensorineural hearing loss, bilateral: Secondary | ICD-10-CM | POA: Diagnosis not present

## 2020-06-30 DIAGNOSIS — Z8509 Personal history of malignant neoplasm of other digestive organs: Secondary | ICD-10-CM | POA: Diagnosis not present

## 2020-06-30 DIAGNOSIS — Z08 Encounter for follow-up examination after completed treatment for malignant neoplasm: Secondary | ICD-10-CM | POA: Diagnosis not present

## 2020-06-30 DIAGNOSIS — H9190 Unspecified hearing loss, unspecified ear: Secondary | ICD-10-CM | POA: Diagnosis not present

## 2020-06-30 DIAGNOSIS — C07 Malignant neoplasm of parotid gland: Secondary | ICD-10-CM | POA: Diagnosis not present

## 2020-06-30 DIAGNOSIS — Z923 Personal history of irradiation: Secondary | ICD-10-CM | POA: Diagnosis not present

## 2020-07-06 ENCOUNTER — Other Ambulatory Visit: Payer: Self-pay

## 2020-07-06 ENCOUNTER — Ambulatory Visit: Payer: Medicare PPO | Admitting: Dermatology

## 2020-07-06 DIAGNOSIS — Z86018 Personal history of other benign neoplasm: Secondary | ICD-10-CM

## 2020-07-06 DIAGNOSIS — D0439 Carcinoma in situ of skin of other parts of face: Secondary | ICD-10-CM

## 2020-07-06 DIAGNOSIS — Z1283 Encounter for screening for malignant neoplasm of skin: Secondary | ICD-10-CM | POA: Diagnosis not present

## 2020-07-06 DIAGNOSIS — L820 Inflamed seborrheic keratosis: Secondary | ICD-10-CM

## 2020-07-06 DIAGNOSIS — Z85828 Personal history of other malignant neoplasm of skin: Secondary | ICD-10-CM | POA: Diagnosis not present

## 2020-07-06 DIAGNOSIS — D485 Neoplasm of uncertain behavior of skin: Secondary | ICD-10-CM

## 2020-07-06 NOTE — Patient Instructions (Signed)

## 2020-07-14 ENCOUNTER — Telehealth: Payer: Self-pay

## 2020-07-14 NOTE — Telephone Encounter (Signed)
lmtc 6 month recheck per Dr Denna Haggard

## 2020-07-14 NOTE — Telephone Encounter (Signed)
-----   Message from Lavonna Monarch, MD sent at 07/13/2020 11:35 PM EDT ----- She had a history of an aggressive squamous cell carcinoma so please reassure her that the 2 skin cancers were in situ with essentially no risk of any spread.  The treatment done at the time of the biopsy will cure the majority of these and I will recheck her in 6 months instead of a year just to be certain.

## 2020-07-15 DIAGNOSIS — Z1231 Encounter for screening mammogram for malignant neoplasm of breast: Secondary | ICD-10-CM | POA: Diagnosis not present

## 2020-07-20 ENCOUNTER — Encounter: Payer: Self-pay | Admitting: Dermatology

## 2020-07-20 NOTE — Progress Notes (Signed)
Follow-Up Visit   Subjective  Maria Rogers is a 75 y.o. female who presents for the following: Annual Exam (Patient here today for yearly skin check. Per patient she has a lesion on her left cheek that has been treated in the past that has returned. Patient would also like a few other lesions on her face checked. No bleeding. Personal history or atypical moles and non mole skin cancer. No personal history of melanoma. No family history of atypical moles, melanoma or non mole skin cancer. ).  Several crusts on face, most concerned arm residual spot on left inner cheek and new spot on left forehead Location:  Duration:  Quality:  Associated Signs/Symptoms: Modifying Factors:  Severity:  Timing: Context:   Objective  Well appearing patient in no apparent distress; mood and affect are within normal limits. Objective  Left Forehead, Left Inner Cheek, Right Temple: The left cheek lesion appears to be residual of a carcinoma in situ biopsied last year.  We had plan to implement Aldara therapy if there was any residual after biopsy but apparently this was not done.  After rebiopsy the base was treated with curettage and cautery.  Similarly, the 1 cm crust on the left forehead was shave biopsied followed by curettage plus cautery.  Images                A focused examination was performed including Head and neck.. Relevant physical exam findings are noted in the Assessment and Plan.   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 (3) Right Temple; Left Inner Cheek; Left Forehead  Skin / nail biopsy - Left Inner Cheek Type of biopsy: tangential   Informed consent: discussed and consent obtained   Timeout: patient name, date of birth, surgical site, and procedure verified   Procedure prep:  Patient was prepped and draped in usual sterile fashion (Non sterile) Prep type:  Chlorhexidine Anesthesia: the lesion was anesthetized in a standard  fashion   Anesthetic:  1% lidocaine w/ epinephrine 1-100,000 local infiltration Instrument used: flexible razor blade   Hemostasis achieved with: ferric subsulfate   Outcome: patient tolerated procedure well   Post-procedure details: sterile dressing applied and wound care instructions given   Dressing type: bandage and petrolatum    Skin / nail biopsy - Left Forehead Type of biopsy: tangential   Informed consent: discussed and consent obtained   Timeout: patient name, date of birth, surgical site, and procedure verified   Procedure prep:  Patient was prepped and draped in usual sterile fashion (Non sterile) Prep type:  Chlorhexidine Anesthesia: the lesion was anesthetized in a standard fashion   Anesthetic:  1% lidocaine w/ epinephrine 1-100,000 local infiltration Instrument used: flexible razor blade   Outcome: patient tolerated procedure well   Post-procedure details: wound care instructions given    Skin / nail biopsy - Right Temple Type of biopsy: tangential   Informed consent: discussed and consent obtained   Timeout: patient name, date of birth, surgical site, and procedure verified   Procedure prep:  Patient was prepped and draped in usual sterile fashion (Non sterile) Prep type:  Chlorhexidine Anesthesia: the lesion was anesthetized in a standard fashion   Anesthetic:  1% lidocaine w/ epinephrine 1-100,000 local infiltration Instrument used: flexible razor blade   Outcome: patient tolerated procedure well   Post-procedure details: wound care instructions given    Destruction of lesion - Left Forehead Complexity: simple   Destruction method:  electrodesiccation and curettage   Informed consent: discussed and consent obtained   Timeout:  patient name, date of birth, surgical site, and procedure verified Anesthesia: the lesion was anesthetized in a standard fashion   Anesthetic:  1% lidocaine w/ epinephrine 1-100,000 local infiltration Curettage performed in three  different directions: Yes   Electrodesiccation performed over the curetted area: Yes   Curettage cycles:  1 Lesion length (cm):  0.6 Lesion width (cm):  0.6 Margin per side (cm):  0 Final wound size (cm):  0.6 Hemostasis achieved with:  ferric subsulfate and electrodesiccation Outcome: patient tolerated procedure well with no complications   Post-procedure details: sterile dressing applied and wound care instructions given   Dressing type: bandage and petrolatum    Destruction of lesion - Left Inner Cheek Complexity: simple   Destruction method: electrodesiccation and curettage   Informed consent: discussed and consent obtained   Timeout:  patient name, date of birth, surgical site, and procedure verified Anesthesia: the lesion was anesthetized in a standard fashion   Anesthetic:  1% lidocaine w/ epinephrine 1-100,000 local infiltration Curettage performed in three different directions: Yes   Curettage cycles:  3 Lesion length (cm):  1.2 Lesion width (cm):  1.2 Margin per side (cm):  0 Final wound size (cm):  1.2 Hemostasis achieved with:  ferric subsulfate and electrodesiccation Outcome: patient tolerated procedure well with no complications   Post-procedure details: sterile dressing applied and wound care instructions given   Dressing type: bandage and petrolatum    Destruction of lesion - Right Temple Complexity: simple   Destruction method: electrodesiccation and curettage   Informed consent: discussed and consent obtained   Timeout:  patient name, date of birth, surgical site, and procedure verified Anesthesia: the lesion was anesthetized in a standard fashion   Anesthetic:  1% lidocaine w/ epinephrine 1-100,000 local infiltration Curettage performed in three different directions: Yes   Electrodesiccation performed over the curetted area: Yes   Curettage cycles:  1 Margin per side (cm):  0.1 Final wound size (cm):  0.5 Hemostasis achieved with:  ferric subsulfate and  electrodesiccation Outcome: patient tolerated procedure well with no complications   Post-procedure details: sterile dressing applied and wound care instructions given   Dressing type: bandage and petrolatum    Specimen 1 - Surgical pathology Differential Diagnosis: R/O BCC vs SCC - treated after biopsy  Check Margins: No  Specimen 2 - Surgical pathology Differential Diagnosis: R/O BCC vs SCC - treated after biopsy  Check Margins: No  Specimen 3 - Surgical pathology Differential Diagnosis: R/O BCC vs SCC - treated after biopsy  Check Margins: No      I, Lavonna Monarch, MD, have reviewed all documentation for this visit.  The documentation on 07/20/20 for the exam, diagnosis, procedures, and orders are all accurate and complete.

## 2020-07-26 ENCOUNTER — Telehealth: Payer: Self-pay

## 2020-07-26 NOTE — Telephone Encounter (Signed)
Phone call to patient with her pathology results. Voicemail left for patient to give the office a call back.  

## 2020-07-27 ENCOUNTER — Telehealth: Payer: Self-pay | Admitting: Dermatology

## 2020-07-27 NOTE — Telephone Encounter (Signed)
Returning call. Results?

## 2020-07-27 NOTE — Telephone Encounter (Signed)
-----   Message from Lavonna Monarch, MD sent at 07/13/2020 11:35 PM EDT ----- She had a history of an aggressive squamous cell carcinoma so please reassure her that the 2 skin cancers were in situ with essentially no risk of any spread.  The treatment done at the time of the biopsy will cure the majority of these and I will recheck her in 6 months instead of a year just to be certain.

## 2020-07-27 NOTE — Telephone Encounter (Signed)
Phone call to patient returning her call. Patient's pathology results given to her. Patient also wanted to know if we could mail her a copy of her pathology results for her cancer policy. Pathology results mailed.

## 2020-07-27 NOTE — Progress Notes (Addendum)
Follow-Up Visit   Subjective  Maria Rogers is a 75 y.o. female who presents for the following: Annual Exam (Patient here today for yearly skin check. Per patient she has a lesion on her left cheek that has been treated in the past that has returned. Patient would also like a few other lesions on her face checked. No bleeding. Personal history or atypical moles and non mole skin cancer. No personal history of melanoma. No family history of atypical moles, melanoma or non mole skin cancer. ).  General skin examination, recurrent spot on left cheek plus several other spots to check Location:  Duration:  Quality:  Associated Signs/Symptoms: Modifying Factors:  Severity:  Timing: Context:   Objective  Well appearing patient in no apparent distress; mood and affect are within normal limits. Left Forehead, Left Inner Cheek, Right Temple The left cheek lesion appears to be residual of a carcinoma in situ biopsied last year.  We had plan to implement Aldara therapy if there was any residual after biopsy but apparently this was not done.  After rebiopsy the base was treated with curettage and cautery.  Similarly, the 1 cm crust on the left forehead was shave biopsied followed by curettage plus cautery.                 A full examination was performed including scalp, head, eyes, ears, nose, lips, neck, chest, axillae, abdomen, back, buttocks, bilateral upper extremities, bilateral lower extremities, hands, feet, fingers, toes, fingernails, and toenails. All findings within normal limits unless otherwise noted below.  Areas beneath undergarments not fully examined.   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 (3) Right Temple; Left Inner Cheek; Left Forehead  Skin / nail biopsy - Left Inner Cheek Type of biopsy: tangential   Informed consent: discussed and consent obtained   Timeout: patient name, date of birth, surgical site, and procedure  verified   Procedure prep:  Patient was prepped and draped in usual sterile fashion (Non sterile) Prep type:  Chlorhexidine Anesthesia: the lesion was anesthetized in a standard fashion   Anesthetic:  1% lidocaine w/ epinephrine 1-100,000 local infiltration Instrument used: flexible razor blade   Hemostasis achieved with: ferric subsulfate   Outcome: patient tolerated procedure well   Post-procedure details: sterile dressing applied and wound care instructions given   Dressing type: bandage and petrolatum    Skin / nail biopsy - Left Forehead Type of biopsy: tangential   Informed consent: discussed and consent obtained   Timeout: patient name, date of birth, surgical site, and procedure verified   Procedure prep:  Patient was prepped and draped in usual sterile fashion (Non sterile) Prep type:  Chlorhexidine Anesthesia: the lesion was anesthetized in a standard fashion   Anesthetic:  1% lidocaine w/ epinephrine 1-100,000 local infiltration Instrument used: flexible razor blade   Outcome: patient tolerated procedure well   Post-procedure details: wound care instructions given    Skin / nail biopsy - Right Temple Type of biopsy: tangential   Informed consent: discussed and consent obtained   Timeout: patient name, date of birth, surgical site, and procedure verified   Procedure prep:  Patient was prepped and draped in usual sterile fashion (Non sterile) Prep type:  Chlorhexidine Anesthesia: the lesion was anesthetized in a standard fashion   Anesthetic:  1% lidocaine w/ epinephrine 1-100,000 local infiltration Instrument used: flexible razor blade   Outcome: patient tolerated procedure well   Post-procedure details:  wound care instructions given    Destruction of lesion - Left Forehead Complexity: simple   Destruction method: electrodesiccation and curettage   Informed consent: discussed and consent obtained   Timeout:  patient name, date of birth, surgical site, and procedure  verified Anesthesia: the lesion was anesthetized in a standard fashion   Anesthetic:  1% lidocaine w/ epinephrine 1-100,000 local infiltration Curettage performed in three different directions: Yes   Electrodesiccation performed over the curetted area: Yes   Curettage cycles:  1 Lesion length (cm):  0.6 Lesion width (cm):  0.6 Margin per side (cm):  0 Final wound size (cm):  0.6 Hemostasis achieved with:  ferric subsulfate and electrodesiccation Outcome: patient tolerated procedure well with no complications   Post-procedure details: sterile dressing applied and wound care instructions given   Dressing type: bandage and petrolatum    Destruction of lesion - Left Inner Cheek Complexity: simple   Destruction method: electrodesiccation and curettage   Informed consent: discussed and consent obtained   Timeout:  patient name, date of birth, surgical site, and procedure verified Anesthesia: the lesion was anesthetized in a standard fashion   Anesthetic:  1% lidocaine w/ epinephrine 1-100,000 local infiltration Curettage performed in three different directions: Yes   Curettage cycles:  3 Lesion length (cm):  1.2 Lesion width (cm):  1.2 Margin per side (cm):  0 Final wound size (cm):  1.2 Hemostasis achieved with:  ferric subsulfate and electrodesiccation Outcome: patient tolerated procedure well with no complications   Post-procedure details: sterile dressing applied and wound care instructions given   Dressing type: bandage and petrolatum    Destruction of lesion - Right Temple Complexity: simple   Destruction method: electrodesiccation and curettage   Informed consent: discussed and consent obtained   Timeout:  patient name, date of birth, surgical site, and procedure verified Anesthesia: the lesion was anesthetized in a standard fashion   Anesthetic:  1% lidocaine w/ epinephrine 1-100,000 local infiltration Curettage performed in three different directions: Yes    Electrodesiccation performed over the curetted area: Yes   Curettage cycles:  1 Margin per side (cm):  0.1 Final wound size (cm):  0.5 Hemostasis achieved with:  ferric subsulfate and electrodesiccation Outcome: patient tolerated procedure well with no complications   Post-procedure details: sterile dressing applied and wound care instructions given   Dressing type: bandage and petrolatum    Specimen 1 - Surgical pathology Differential Diagnosis: R/O BCC vs SCC - treated after biopsy  Check Margins: No  Specimen 2 - Surgical pathology Differential Diagnosis: R/O BCC vs SCC - treated after biopsy  Check Margins: No  Specimen 3 - Surgical pathology Differential Diagnosis: R/O BCC vs SCC - treated after biopsy  Check Margins: No      I, Lavonna Monarch, MD, have reviewed all documentation for this visit.  The documentation on 07/27/20 for the exam, diagnosis, procedures, and orders are all accurate and complete.

## 2020-07-28 NOTE — Addendum Note (Signed)
Addended by: Lavonna Monarch on: 07/28/2020 07:16 AM   Modules accepted: Orders

## 2020-07-28 NOTE — Addendum Note (Signed)
Addended by: Lavonna Monarch on: 07/28/2020 07:06 AM   Modules accepted: Level of Service

## 2020-09-17 DIAGNOSIS — E039 Hypothyroidism, unspecified: Secondary | ICD-10-CM | POA: Diagnosis not present

## 2020-09-17 DIAGNOSIS — Z0001 Encounter for general adult medical examination with abnormal findings: Secondary | ICD-10-CM | POA: Diagnosis not present

## 2020-09-17 DIAGNOSIS — Z1159 Encounter for screening for other viral diseases: Secondary | ICD-10-CM | POA: Diagnosis not present

## 2020-09-17 DIAGNOSIS — Z6826 Body mass index (BMI) 26.0-26.9, adult: Secondary | ICD-10-CM | POA: Diagnosis not present

## 2020-09-17 DIAGNOSIS — E7849 Other hyperlipidemia: Secondary | ICD-10-CM | POA: Diagnosis not present

## 2020-09-17 DIAGNOSIS — F17210 Nicotine dependence, cigarettes, uncomplicated: Secondary | ICD-10-CM | POA: Diagnosis not present

## 2020-09-17 DIAGNOSIS — I10 Essential (primary) hypertension: Secondary | ICD-10-CM | POA: Diagnosis not present

## 2020-09-17 DIAGNOSIS — F331 Major depressive disorder, recurrent, moderate: Secondary | ICD-10-CM | POA: Diagnosis not present

## 2020-09-17 DIAGNOSIS — E782 Mixed hyperlipidemia: Secondary | ICD-10-CM | POA: Diagnosis not present

## 2020-09-23 DIAGNOSIS — E039 Hypothyroidism, unspecified: Secondary | ICD-10-CM | POA: Diagnosis not present

## 2020-09-23 DIAGNOSIS — M1711 Unilateral primary osteoarthritis, right knee: Secondary | ICD-10-CM | POA: Diagnosis not present

## 2020-09-23 DIAGNOSIS — L820 Inflamed seborrheic keratosis: Secondary | ICD-10-CM | POA: Diagnosis not present

## 2020-09-23 DIAGNOSIS — B351 Tinea unguium: Secondary | ICD-10-CM | POA: Diagnosis not present

## 2020-09-23 DIAGNOSIS — Z23 Encounter for immunization: Secondary | ICD-10-CM | POA: Diagnosis not present

## 2020-09-23 DIAGNOSIS — E7849 Other hyperlipidemia: Secondary | ICD-10-CM | POA: Diagnosis not present

## 2020-09-23 DIAGNOSIS — Z6826 Body mass index (BMI) 26.0-26.9, adult: Secondary | ICD-10-CM | POA: Diagnosis not present

## 2020-09-23 DIAGNOSIS — F331 Major depressive disorder, recurrent, moderate: Secondary | ICD-10-CM | POA: Diagnosis not present

## 2020-11-15 DIAGNOSIS — Z85858 Personal history of malignant neoplasm of other endocrine glands: Secondary | ICD-10-CM | POA: Diagnosis not present

## 2020-11-15 DIAGNOSIS — H903 Sensorineural hearing loss, bilateral: Secondary | ICD-10-CM | POA: Diagnosis not present

## 2020-11-15 DIAGNOSIS — H6123 Impacted cerumen, bilateral: Secondary | ICD-10-CM | POA: Diagnosis not present

## 2021-03-23 ENCOUNTER — Other Ambulatory Visit: Payer: Self-pay

## 2021-03-23 ENCOUNTER — Encounter: Payer: Self-pay | Admitting: Dermatology

## 2021-03-23 ENCOUNTER — Ambulatory Visit: Payer: Medicare PPO | Admitting: Dermatology

## 2021-03-23 DIAGNOSIS — Z86018 Personal history of other benign neoplasm: Secondary | ICD-10-CM | POA: Diagnosis not present

## 2021-03-23 DIAGNOSIS — C44319 Basal cell carcinoma of skin of other parts of face: Secondary | ICD-10-CM

## 2021-03-23 DIAGNOSIS — Z85828 Personal history of other malignant neoplasm of skin: Secondary | ICD-10-CM | POA: Diagnosis not present

## 2021-03-23 DIAGNOSIS — D0439 Carcinoma in situ of skin of other parts of face: Secondary | ICD-10-CM | POA: Diagnosis not present

## 2021-03-23 DIAGNOSIS — L570 Actinic keratosis: Secondary | ICD-10-CM

## 2021-03-23 DIAGNOSIS — D485 Neoplasm of uncertain behavior of skin: Secondary | ICD-10-CM

## 2021-03-23 DIAGNOSIS — C44519 Basal cell carcinoma of skin of other part of trunk: Secondary | ICD-10-CM | POA: Diagnosis not present

## 2021-03-23 NOTE — Patient Instructions (Signed)

## 2021-03-28 ENCOUNTER — Telehealth: Payer: Self-pay | Admitting: *Deleted

## 2021-03-28 DIAGNOSIS — I10 Essential (primary) hypertension: Secondary | ICD-10-CM | POA: Diagnosis not present

## 2021-03-28 DIAGNOSIS — E7849 Other hyperlipidemia: Secondary | ICD-10-CM | POA: Diagnosis not present

## 2021-03-28 DIAGNOSIS — E782 Mixed hyperlipidemia: Secondary | ICD-10-CM | POA: Diagnosis not present

## 2021-03-28 DIAGNOSIS — E039 Hypothyroidism, unspecified: Secondary | ICD-10-CM | POA: Diagnosis not present

## 2021-03-28 NOTE — Telephone Encounter (Signed)
-----   Message from Lavonna Monarch, MD sent at 03/25/2021  4:11 PM EST ----- Please schedule his last surgery of the morning early afternoon and will decide at that time which ones to treat

## 2021-03-28 NOTE — Telephone Encounter (Signed)
Left patient message to call back for results.  

## 2021-03-29 NOTE — Telephone Encounter (Addendum)
Patient is returning telephone call about results.

## 2021-03-29 NOTE — Telephone Encounter (Signed)
[place] office patient needs end of day surgery

## 2021-03-30 NOTE — Telephone Encounter (Signed)
Pathology to patient-30 minute surgery appointment scheduled for last afternoon appointment per dr tafeen.

## 2021-03-30 NOTE — Telephone Encounter (Signed)
Patient returning our call for results. She asks that return call be made to her cell number.

## 2021-03-31 DIAGNOSIS — L820 Inflamed seborrheic keratosis: Secondary | ICD-10-CM | POA: Diagnosis not present

## 2021-03-31 DIAGNOSIS — Z6826 Body mass index (BMI) 26.0-26.9, adult: Secondary | ICD-10-CM | POA: Diagnosis not present

## 2021-03-31 DIAGNOSIS — E039 Hypothyroidism, unspecified: Secondary | ICD-10-CM | POA: Diagnosis not present

## 2021-03-31 DIAGNOSIS — E7849 Other hyperlipidemia: Secondary | ICD-10-CM | POA: Diagnosis not present

## 2021-03-31 DIAGNOSIS — F331 Major depressive disorder, recurrent, moderate: Secondary | ICD-10-CM | POA: Diagnosis not present

## 2021-03-31 DIAGNOSIS — M1711 Unilateral primary osteoarthritis, right knee: Secondary | ICD-10-CM | POA: Diagnosis not present

## 2021-04-11 ENCOUNTER — Encounter: Payer: Self-pay | Admitting: Dermatology

## 2021-04-11 NOTE — Progress Notes (Signed)
Follow-Up Visit   Subjective  Maria Rogers is a 76 y.o. female who presents for the following: Skin Problem (Lesion on lips x years. Lesion on left cheek x months, lesion on left temple x months, lesion on middle of back x months.Personal history of bcc, scc and atypical moles. ).  Multiple new crusts on face and lip, also check back Location:  Duration:  Quality:  Associated Signs/Symptoms: Modifying Factors:  Severity:  Timing: Context:   Objective  Well appearing patient in no apparent distress; mood and affect are within normal limits. Left Buccal Cheek 1 cm waxy pink crust, ill-defined margins       Left Malar Cheek 1 cm waxy pink crust       Left Lower Back Centrally eroded 1 cm waxy pink crust       Left Temporal Scalp, Mid Lower Vermilion Lip (3), Mid Upper Vermilion Lip (2), Right Temporal Scalp (3) 10 gritty pink crusts including multiple on the lower lip.    All sun exposed areas plus back examined.   Assessment & Plan    Neoplasm of uncertain behavior of skin (3) Left Buccal Cheek  Skin / nail biopsy Type of biopsy: tangential   Informed consent: discussed and consent obtained   Timeout: patient name, date of birth, surgical site, and procedure verified   Anesthesia: the lesion was anesthetized in a standard fashion   Anesthetic:  1% lidocaine w/ epinephrine 1-100,000 local infiltration Instrument used: flexible razor blade   Hemostasis achieved with: aluminum chloride and electrodesiccation   Outcome: patient tolerated procedure well   Post-procedure details: wound care instructions given    Specimen 1 - Surgical pathology Differential Diagnosis: bcc vs scc  Check Margins: No  Left Malar Cheek  Skin / nail biopsy Type of biopsy: tangential   Informed consent: discussed and consent obtained   Timeout: patient name, date of birth, surgical site, and procedure verified   Anesthesia: the lesion was anesthetized in a  standard fashion   Anesthetic:  1% lidocaine w/ epinephrine 1-100,000 local infiltration Instrument used: flexible razor blade   Hemostasis achieved with: aluminum chloride and electrodesiccation   Outcome: patient tolerated procedure well   Post-procedure details: wound care instructions given    Specimen 2 - Surgical pathology Differential Diagnosis: bcc vs scc  Check Margins: No  Left Lower Back  Skin / nail biopsy Type of biopsy: tangential   Informed consent: discussed and consent obtained   Timeout: patient name, date of birth, surgical site, and procedure verified   Anesthesia: the lesion was anesthetized in a standard fashion   Anesthetic:  1% lidocaine w/ epinephrine 1-100,000 local infiltration Instrument used: flexible razor blade   Hemostasis achieved with: aluminum chloride and electrodesiccation   Outcome: patient tolerated procedure well   Post-procedure details: wound care instructions given    Specimen 3 - Surgical pathology Differential Diagnosis: scc vs bcc  Check Margins: No  AK (actinic keratosis) (9) Mid Upper Vermilion Lip (2); Mid Lower Vermilion Lip (3); Left Temporal Scalp; Right Temporal Scalp (3)  I expect this will take at least 2 freezes to clear the majority; if the lip biopsy proves freeze resistant, will obtain biopsies.  Destruction of lesion - Left Temporal Scalp, Mid Lower Vermilion Lip, Mid Upper Vermilion Lip, Right Temporal Scalp Complexity: simple   Destruction method: cryotherapy   Informed consent: discussed and consent obtained   Timeout:  patient name, date of birth, surgical site, and procedure verified Lesion destroyed using liquid  nitrogen: Yes   Cryotherapy cycles:  1 Outcome: patient tolerated procedure well with no complications   Post-procedure details: wound care instructions given        I, Lavonna Monarch, MD, have reviewed all documentation for this visit.  The documentation on 04/11/21 for the exam, diagnosis,  procedures, and orders are all accurate and complete.

## 2021-05-05 ENCOUNTER — Encounter: Payer: Medicare PPO | Admitting: Dermatology

## 2021-05-17 DIAGNOSIS — H6121 Impacted cerumen, right ear: Secondary | ICD-10-CM | POA: Diagnosis not present

## 2021-05-17 DIAGNOSIS — H903 Sensorineural hearing loss, bilateral: Secondary | ICD-10-CM | POA: Diagnosis not present

## 2021-05-17 DIAGNOSIS — Z85858 Personal history of malignant neoplasm of other endocrine glands: Secondary | ICD-10-CM | POA: Diagnosis not present

## 2021-05-19 ENCOUNTER — Encounter: Payer: Medicare PPO | Admitting: Dermatology

## 2021-06-13 DIAGNOSIS — H524 Presbyopia: Secondary | ICD-10-CM | POA: Diagnosis not present

## 2021-06-13 DIAGNOSIS — H43813 Vitreous degeneration, bilateral: Secondary | ICD-10-CM | POA: Diagnosis not present

## 2021-06-14 ENCOUNTER — Encounter (INDEPENDENT_AMBULATORY_CARE_PROVIDER_SITE_OTHER): Payer: Self-pay

## 2021-06-14 ENCOUNTER — Encounter: Payer: Self-pay | Admitting: Dermatology

## 2021-06-14 ENCOUNTER — Ambulatory Visit (INDEPENDENT_AMBULATORY_CARE_PROVIDER_SITE_OTHER): Payer: Medicare PPO | Admitting: Dermatology

## 2021-06-14 DIAGNOSIS — C44329 Squamous cell carcinoma of skin of other parts of face: Secondary | ICD-10-CM | POA: Diagnosis not present

## 2021-06-14 DIAGNOSIS — C44519 Basal cell carcinoma of skin of other part of trunk: Secondary | ICD-10-CM | POA: Diagnosis not present

## 2021-06-14 DIAGNOSIS — C44719 Basal cell carcinoma of skin of left lower limb, including hip: Secondary | ICD-10-CM

## 2021-06-14 DIAGNOSIS — C44320 Squamous cell carcinoma of skin of unspecified parts of face: Secondary | ICD-10-CM

## 2021-06-14 NOTE — Patient Instructions (Signed)

## 2021-07-02 ENCOUNTER — Encounter: Payer: Self-pay | Admitting: Dermatology

## 2021-07-02 NOTE — Progress Notes (Signed)
Follow-Up Visit   Subjective  Maria Rogers is a 76 y.o. female who presents for the following: Procedure (Scc x2 left buccal cheek and left malar cheek  bcc x 1 left lower back).  Multiple biopsy-proven nonmelanoma skin cancers Location:  Duration:  Quality:  Associated Signs/Symptoms: Modifying Factors:  Severity:  Timing: Context:   Objective  Well appearing patient in no apparent distress; mood and affect are within normal limits. Left Lower Back Lesion identified by Dr.Dorothe Elmore and nurse in room.    Left Buccal Cheek Lesion identified by Dr.Constantine Ruddick and nurse in room.    Left Malar Cheek Lesion identified by Dr.Brailyn Killion and nurse in room.      A focused examination was performed including head, neck, arms, legs.. Relevant physical exam findings are noted in the Assessment and Plan.   Assessment & Plan    Basal cell carcinoma (BCC) of skin of left lower extremity including hip Left Lower Back  Destruction of lesion Complexity: simple   Destruction method: electrodesiccation and curettage   Informed consent: discussed and consent obtained   Timeout:  patient name, date of birth, surgical site, and procedure verified Anesthesia: the lesion was anesthetized in a standard fashion   Anesthetic:  1% lidocaine w/ epinephrine 1-100,000 local infiltration Curettage performed in three different directions: Yes     Electrodesiccation performed over the curetted area: No   Curettage cycles:  3 Lesion length (cm):  2.3 Lesion width (cm):  2.3 Margin per side (cm):  0 Final wound size (cm):  2.3 Hemostasis achieved with:  ferric subsulfate Outcome: patient tolerated procedure well with no complications   Post-procedure details: sterile dressing applied and wound care instructions given   Dressing type: bandage and pressure dressing   Additional details:  Wound innoculated with 5 fluorouracil solution.  Squamous cell carcinoma of skin of face (2) Left Buccal  Cheek  Destruction of lesion Complexity: simple   Destruction method: electrodesiccation and curettage   Informed consent: discussed and consent obtained   Timeout:  patient name, date of birth, surgical site, and procedure verified Anesthesia: the lesion was anesthetized in a standard fashion   Anesthetic:  1% lidocaine w/ epinephrine 1-100,000 local infiltration Curettage performed in three different directions: Yes   Electrodesiccation performed over the curetted area: Yes   Curettage cycles:  3 Lesion length (cm):  3 Lesion width (cm):  3 Margin per side (cm):  0 Final wound size (cm):  3 Hemostasis achieved with:  ferric subsulfate Outcome: patient tolerated procedure well with no complications   Post-procedure details: sterile dressing applied and wound care instructions given   Dressing type: bandage and petrolatum   Additional details:  Wound innoculated with 5 fluorouracil solution.  Left Malar Cheek  Destruction of lesion Complexity: simple   Destruction method: electrodesiccation and curettage   Informed consent: discussed and consent obtained   Timeout:  patient name, date of birth, surgical site, and procedure verified Anesthesia: the lesion was anesthetized in a standard fashion   Anesthetic:  1% lidocaine w/ epinephrine 1-100,000 local infiltration Curettage performed in three different directions: Yes   Electrodesiccation performed over the curetted area: Yes   Curettage cycles:  3 Lesion length (cm):  2 Lesion width (cm):  2 Margin per side (cm):  0 Final wound size (cm):  2 Hemostasis achieved with:  ferric subsulfate and electrodesiccation Outcome: patient tolerated procedure well with no complications   Post-procedure details: sterile dressing applied and wound care instructions given   Dressing  type: bandage and petrolatum   Additional details:  Wound innoculated with 5 fluorouracil solution.      I, Lavonna Monarch, MD, have reviewed all  documentation for this visit.  The documentation on 07/02/21 for the exam, diagnosis, procedures, and orders are all accurate and complete.

## 2021-07-06 ENCOUNTER — Ambulatory Visit: Payer: Medicare PPO | Admitting: Dermatology

## 2021-07-13 ENCOUNTER — Ambulatory Visit: Payer: Medicare PPO | Admitting: Dermatology

## 2021-07-28 DIAGNOSIS — C07 Malignant neoplasm of parotid gland: Secondary | ICD-10-CM | POA: Diagnosis not present

## 2021-10-03 DIAGNOSIS — E039 Hypothyroidism, unspecified: Secondary | ICD-10-CM | POA: Diagnosis not present

## 2021-10-03 DIAGNOSIS — E7849 Other hyperlipidemia: Secondary | ICD-10-CM | POA: Diagnosis not present

## 2021-10-03 DIAGNOSIS — I10 Essential (primary) hypertension: Secondary | ICD-10-CM | POA: Diagnosis not present

## 2021-10-03 DIAGNOSIS — Z0001 Encounter for general adult medical examination with abnormal findings: Secondary | ICD-10-CM | POA: Diagnosis not present

## 2021-10-07 DIAGNOSIS — E039 Hypothyroidism, unspecified: Secondary | ICD-10-CM | POA: Diagnosis not present

## 2021-10-07 DIAGNOSIS — M1711 Unilateral primary osteoarthritis, right knee: Secondary | ICD-10-CM | POA: Diagnosis not present

## 2021-10-07 DIAGNOSIS — E7849 Other hyperlipidemia: Secondary | ICD-10-CM | POA: Diagnosis not present

## 2021-10-07 DIAGNOSIS — F331 Major depressive disorder, recurrent, moderate: Secondary | ICD-10-CM | POA: Diagnosis not present

## 2021-10-07 DIAGNOSIS — M79674 Pain in right toe(s): Secondary | ICD-10-CM | POA: Diagnosis not present

## 2021-10-07 DIAGNOSIS — L820 Inflamed seborrheic keratosis: Secondary | ICD-10-CM | POA: Diagnosis not present

## 2021-10-07 DIAGNOSIS — Z6825 Body mass index (BMI) 25.0-25.9, adult: Secondary | ICD-10-CM | POA: Diagnosis not present

## 2021-10-07 DIAGNOSIS — Z0001 Encounter for general adult medical examination with abnormal findings: Secondary | ICD-10-CM | POA: Diagnosis not present

## 2021-10-10 DIAGNOSIS — Z1231 Encounter for screening mammogram for malignant neoplasm of breast: Secondary | ICD-10-CM | POA: Diagnosis not present

## 2021-11-10 DIAGNOSIS — Z23 Encounter for immunization: Secondary | ICD-10-CM | POA: Diagnosis not present

## 2021-11-10 DIAGNOSIS — Z6832 Body mass index (BMI) 32.0-32.9, adult: Secondary | ICD-10-CM | POA: Diagnosis not present

## 2021-11-14 DIAGNOSIS — H906 Mixed conductive and sensorineural hearing loss, bilateral: Secondary | ICD-10-CM | POA: Diagnosis not present

## 2021-11-14 DIAGNOSIS — H6123 Impacted cerumen, bilateral: Secondary | ICD-10-CM | POA: Diagnosis not present

## 2021-11-14 DIAGNOSIS — H608X3 Other otitis externa, bilateral: Secondary | ICD-10-CM | POA: Diagnosis not present

## 2021-11-14 DIAGNOSIS — H6983 Other specified disorders of Eustachian tube, bilateral: Secondary | ICD-10-CM | POA: Diagnosis not present

## 2021-12-02 DIAGNOSIS — Z23 Encounter for immunization: Secondary | ICD-10-CM | POA: Diagnosis not present

## 2021-12-19 ENCOUNTER — Ambulatory Visit: Payer: Medicare PPO | Admitting: Dermatology

## 2022-03-06 DIAGNOSIS — D225 Melanocytic nevi of trunk: Secondary | ICD-10-CM | POA: Diagnosis not present

## 2022-03-06 DIAGNOSIS — X32XXXA Exposure to sunlight, initial encounter: Secondary | ICD-10-CM | POA: Diagnosis not present

## 2022-03-06 DIAGNOSIS — L820 Inflamed seborrheic keratosis: Secondary | ICD-10-CM | POA: Diagnosis not present

## 2022-03-06 DIAGNOSIS — L570 Actinic keratosis: Secondary | ICD-10-CM | POA: Diagnosis not present

## 2022-03-06 DIAGNOSIS — Z1283 Encounter for screening for malignant neoplasm of skin: Secondary | ICD-10-CM | POA: Diagnosis not present

## 2022-03-31 DIAGNOSIS — E7849 Other hyperlipidemia: Secondary | ICD-10-CM | POA: Diagnosis not present

## 2022-03-31 DIAGNOSIS — E039 Hypothyroidism, unspecified: Secondary | ICD-10-CM | POA: Diagnosis not present

## 2022-03-31 DIAGNOSIS — I10 Essential (primary) hypertension: Secondary | ICD-10-CM | POA: Diagnosis not present

## 2022-05-05 DIAGNOSIS — Z20828 Contact with and (suspected) exposure to other viral communicable diseases: Secondary | ICD-10-CM | POA: Diagnosis not present

## 2022-05-08 DIAGNOSIS — L820 Inflamed seborrheic keratosis: Secondary | ICD-10-CM | POA: Diagnosis not present

## 2022-05-08 DIAGNOSIS — E039 Hypothyroidism, unspecified: Secondary | ICD-10-CM | POA: Diagnosis not present

## 2022-05-08 DIAGNOSIS — E7849 Other hyperlipidemia: Secondary | ICD-10-CM | POA: Diagnosis not present

## 2022-05-08 DIAGNOSIS — Z6825 Body mass index (BMI) 25.0-25.9, adult: Secondary | ICD-10-CM | POA: Diagnosis not present

## 2022-05-08 DIAGNOSIS — M1711 Unilateral primary osteoarthritis, right knee: Secondary | ICD-10-CM | POA: Diagnosis not present

## 2022-05-08 DIAGNOSIS — R030 Elevated blood-pressure reading, without diagnosis of hypertension: Secondary | ICD-10-CM | POA: Diagnosis not present

## 2022-05-08 DIAGNOSIS — M79674 Pain in right toe(s): Secondary | ICD-10-CM | POA: Diagnosis not present

## 2022-05-08 DIAGNOSIS — F331 Major depressive disorder, recurrent, moderate: Secondary | ICD-10-CM | POA: Diagnosis not present

## 2022-06-14 DIAGNOSIS — H906 Mixed conductive and sensorineural hearing loss, bilateral: Secondary | ICD-10-CM | POA: Diagnosis not present

## 2022-06-14 DIAGNOSIS — H608X3 Other otitis externa, bilateral: Secondary | ICD-10-CM | POA: Diagnosis not present

## 2022-06-14 DIAGNOSIS — H6123 Impacted cerumen, bilateral: Secondary | ICD-10-CM | POA: Diagnosis not present

## 2022-06-14 DIAGNOSIS — H6983 Other specified disorders of Eustachian tube, bilateral: Secondary | ICD-10-CM | POA: Diagnosis not present

## 2022-06-27 DIAGNOSIS — M531 Cervicobrachial syndrome: Secondary | ICD-10-CM | POA: Diagnosis not present

## 2022-06-27 DIAGNOSIS — M47812 Spondylosis without myelopathy or radiculopathy, cervical region: Secondary | ICD-10-CM | POA: Diagnosis not present

## 2022-06-27 DIAGNOSIS — S4351XA Sprain of right acromioclavicular joint, initial encounter: Secondary | ICD-10-CM | POA: Diagnosis not present

## 2022-06-27 DIAGNOSIS — M9901 Segmental and somatic dysfunction of cervical region: Secondary | ICD-10-CM | POA: Diagnosis not present

## 2022-06-30 DIAGNOSIS — H353132 Nonexudative age-related macular degeneration, bilateral, intermediate dry stage: Secondary | ICD-10-CM | POA: Diagnosis not present

## 2022-07-04 DIAGNOSIS — M531 Cervicobrachial syndrome: Secondary | ICD-10-CM | POA: Diagnosis not present

## 2022-07-04 DIAGNOSIS — M9901 Segmental and somatic dysfunction of cervical region: Secondary | ICD-10-CM | POA: Diagnosis not present

## 2022-07-04 DIAGNOSIS — S4351XA Sprain of right acromioclavicular joint, initial encounter: Secondary | ICD-10-CM | POA: Diagnosis not present

## 2022-07-04 DIAGNOSIS — M47812 Spondylosis without myelopathy or radiculopathy, cervical region: Secondary | ICD-10-CM | POA: Diagnosis not present

## 2022-07-11 DIAGNOSIS — M9901 Segmental and somatic dysfunction of cervical region: Secondary | ICD-10-CM | POA: Diagnosis not present

## 2022-07-11 DIAGNOSIS — M47812 Spondylosis without myelopathy or radiculopathy, cervical region: Secondary | ICD-10-CM | POA: Diagnosis not present

## 2022-07-11 DIAGNOSIS — S4351XA Sprain of right acromioclavicular joint, initial encounter: Secondary | ICD-10-CM | POA: Diagnosis not present

## 2022-07-11 DIAGNOSIS — M531 Cervicobrachial syndrome: Secondary | ICD-10-CM | POA: Diagnosis not present

## 2022-07-24 DIAGNOSIS — H40013 Open angle with borderline findings, low risk, bilateral: Secondary | ICD-10-CM | POA: Diagnosis not present

## 2022-07-24 DIAGNOSIS — H353111 Nonexudative age-related macular degeneration, right eye, early dry stage: Secondary | ICD-10-CM | POA: Diagnosis not present

## 2022-07-24 DIAGNOSIS — H524 Presbyopia: Secondary | ICD-10-CM | POA: Diagnosis not present

## 2022-07-24 DIAGNOSIS — H26491 Other secondary cataract, right eye: Secondary | ICD-10-CM | POA: Diagnosis not present

## 2022-07-24 DIAGNOSIS — Z961 Presence of intraocular lens: Secondary | ICD-10-CM | POA: Diagnosis not present

## 2022-07-24 DIAGNOSIS — H353122 Nonexudative age-related macular degeneration, left eye, intermediate dry stage: Secondary | ICD-10-CM | POA: Diagnosis not present

## 2022-07-26 DIAGNOSIS — C07 Malignant neoplasm of parotid gland: Secondary | ICD-10-CM | POA: Diagnosis not present

## 2022-07-26 DIAGNOSIS — L247 Irritant contact dermatitis due to plants, except food: Secondary | ICD-10-CM | POA: Diagnosis not present

## 2022-07-26 DIAGNOSIS — R030 Elevated blood-pressure reading, without diagnosis of hypertension: Secondary | ICD-10-CM | POA: Diagnosis not present

## 2022-07-26 DIAGNOSIS — M25511 Pain in right shoulder: Secondary | ICD-10-CM | POA: Diagnosis not present

## 2022-08-15 DIAGNOSIS — H59033 Cystoid macular edema following cataract surgery, bilateral: Secondary | ICD-10-CM | POA: Diagnosis not present

## 2022-08-26 DIAGNOSIS — R58 Hemorrhage, not elsewhere classified: Secondary | ICD-10-CM | POA: Diagnosis not present

## 2022-08-26 DIAGNOSIS — I6782 Cerebral ischemia: Secondary | ICD-10-CM | POA: Diagnosis not present

## 2022-08-26 DIAGNOSIS — S022XXA Fracture of nasal bones, initial encounter for closed fracture: Secondary | ICD-10-CM | POA: Diagnosis not present

## 2022-08-26 DIAGNOSIS — S0121XA Laceration without foreign body of nose, initial encounter: Secondary | ICD-10-CM | POA: Diagnosis not present

## 2022-08-26 DIAGNOSIS — S0101XA Laceration without foreign body of scalp, initial encounter: Secondary | ICD-10-CM | POA: Diagnosis not present

## 2022-08-26 DIAGNOSIS — S022XXB Fracture of nasal bones, initial encounter for open fracture: Secondary | ICD-10-CM | POA: Diagnosis not present

## 2022-08-26 DIAGNOSIS — S0181XA Laceration without foreign body of other part of head, initial encounter: Secondary | ICD-10-CM | POA: Diagnosis not present

## 2022-08-26 DIAGNOSIS — W01198A Fall on same level from slipping, tripping and stumbling with subsequent striking against other object, initial encounter: Secondary | ICD-10-CM | POA: Diagnosis not present

## 2022-08-26 DIAGNOSIS — F32A Depression, unspecified: Secondary | ICD-10-CM | POA: Diagnosis not present

## 2022-08-26 DIAGNOSIS — W19XXXA Unspecified fall, initial encounter: Secondary | ICD-10-CM | POA: Diagnosis not present

## 2022-08-26 DIAGNOSIS — Z743 Need for continuous supervision: Secondary | ICD-10-CM | POA: Diagnosis not present

## 2022-08-26 DIAGNOSIS — R6889 Other general symptoms and signs: Secondary | ICD-10-CM | POA: Diagnosis not present

## 2022-08-26 DIAGNOSIS — K118 Other diseases of salivary glands: Secondary | ICD-10-CM | POA: Diagnosis not present

## 2022-08-26 DIAGNOSIS — E039 Hypothyroidism, unspecified: Secondary | ICD-10-CM | POA: Diagnosis not present

## 2022-08-26 DIAGNOSIS — S0990XA Unspecified injury of head, initial encounter: Secondary | ICD-10-CM | POA: Diagnosis not present

## 2022-08-31 DIAGNOSIS — D321 Benign neoplasm of spinal meninges: Secondary | ICD-10-CM | POA: Diagnosis not present

## 2022-08-31 DIAGNOSIS — S0181XA Laceration without foreign body of other part of head, initial encounter: Secondary | ICD-10-CM | POA: Diagnosis not present

## 2022-08-31 DIAGNOSIS — K118 Other diseases of salivary glands: Secondary | ICD-10-CM | POA: Diagnosis not present

## 2022-08-31 DIAGNOSIS — R030 Elevated blood-pressure reading, without diagnosis of hypertension: Secondary | ICD-10-CM | POA: Diagnosis not present

## 2022-08-31 DIAGNOSIS — Z6826 Body mass index (BMI) 26.0-26.9, adult: Secondary | ICD-10-CM | POA: Diagnosis not present

## 2022-08-31 DIAGNOSIS — S022XXA Fracture of nasal bones, initial encounter for closed fracture: Secondary | ICD-10-CM | POA: Diagnosis not present

## 2022-09-11 DIAGNOSIS — D321 Benign neoplasm of spinal meninges: Secondary | ICD-10-CM | POA: Diagnosis not present

## 2022-09-11 DIAGNOSIS — M4803 Spinal stenosis, cervicothoracic region: Secondary | ICD-10-CM | POA: Diagnosis not present

## 2022-09-11 DIAGNOSIS — M5021 Other cervical disc displacement,  high cervical region: Secondary | ICD-10-CM | POA: Diagnosis not present

## 2022-09-11 DIAGNOSIS — M5023 Other cervical disc displacement, cervicothoracic region: Secondary | ICD-10-CM | POA: Diagnosis not present

## 2022-09-11 DIAGNOSIS — M4802 Spinal stenosis, cervical region: Secondary | ICD-10-CM | POA: Diagnosis not present

## 2022-09-12 DIAGNOSIS — E039 Hypothyroidism, unspecified: Secondary | ICD-10-CM | POA: Diagnosis not present

## 2022-09-12 DIAGNOSIS — F331 Major depressive disorder, recurrent, moderate: Secondary | ICD-10-CM | POA: Diagnosis not present

## 2022-09-12 DIAGNOSIS — L820 Inflamed seborrheic keratosis: Secondary | ICD-10-CM | POA: Diagnosis not present

## 2022-09-12 DIAGNOSIS — S0181XD Laceration without foreign body of other part of head, subsequent encounter: Secondary | ICD-10-CM | POA: Diagnosis not present

## 2022-09-12 DIAGNOSIS — E7849 Other hyperlipidemia: Secondary | ICD-10-CM | POA: Diagnosis not present

## 2022-09-12 DIAGNOSIS — K118 Other diseases of salivary glands: Secondary | ICD-10-CM | POA: Diagnosis not present

## 2022-09-12 DIAGNOSIS — S022XXD Fracture of nasal bones, subsequent encounter for fracture with routine healing: Secondary | ICD-10-CM | POA: Diagnosis not present

## 2022-09-12 DIAGNOSIS — D321 Benign neoplasm of spinal meninges: Secondary | ICD-10-CM | POA: Diagnosis not present

## 2022-09-12 DIAGNOSIS — M1711 Unilateral primary osteoarthritis, right knee: Secondary | ICD-10-CM | POA: Diagnosis not present

## 2022-09-26 DIAGNOSIS — D37030 Neoplasm of uncertain behavior of the parotid salivary glands: Secondary | ICD-10-CM | POA: Diagnosis not present

## 2022-09-26 DIAGNOSIS — C07 Malignant neoplasm of parotid gland: Secondary | ICD-10-CM | POA: Diagnosis not present

## 2022-10-27 DIAGNOSIS — C4490 Unspecified malignant neoplasm of skin, unspecified: Secondary | ICD-10-CM | POA: Diagnosis not present

## 2022-10-27 DIAGNOSIS — R030 Elevated blood-pressure reading, without diagnosis of hypertension: Secondary | ICD-10-CM | POA: Diagnosis not present

## 2022-10-27 DIAGNOSIS — Z6826 Body mass index (BMI) 26.0-26.9, adult: Secondary | ICD-10-CM | POA: Diagnosis not present

## 2022-11-01 DIAGNOSIS — Z6824 Body mass index (BMI) 24.0-24.9, adult: Secondary | ICD-10-CM | POA: Diagnosis not present

## 2022-11-01 DIAGNOSIS — Z20828 Contact with and (suspected) exposure to other viral communicable diseases: Secondary | ICD-10-CM | POA: Diagnosis not present

## 2022-11-01 DIAGNOSIS — R197 Diarrhea, unspecified: Secondary | ICD-10-CM | POA: Diagnosis not present

## 2022-11-01 DIAGNOSIS — R030 Elevated blood-pressure reading, without diagnosis of hypertension: Secondary | ICD-10-CM | POA: Diagnosis not present

## 2022-11-01 DIAGNOSIS — R1110 Vomiting, unspecified: Secondary | ICD-10-CM | POA: Diagnosis not present

## 2022-11-08 DIAGNOSIS — E7849 Other hyperlipidemia: Secondary | ICD-10-CM | POA: Diagnosis not present

## 2022-11-08 DIAGNOSIS — Z0001 Encounter for general adult medical examination with abnormal findings: Secondary | ICD-10-CM | POA: Diagnosis not present

## 2022-11-08 DIAGNOSIS — Z6825 Body mass index (BMI) 25.0-25.9, adult: Secondary | ICD-10-CM | POA: Diagnosis not present

## 2022-11-08 DIAGNOSIS — F331 Major depressive disorder, recurrent, moderate: Secondary | ICD-10-CM | POA: Diagnosis not present

## 2022-11-08 DIAGNOSIS — E039 Hypothyroidism, unspecified: Secondary | ICD-10-CM | POA: Diagnosis not present

## 2022-11-08 DIAGNOSIS — M1711 Unilateral primary osteoarthritis, right knee: Secondary | ICD-10-CM | POA: Diagnosis not present

## 2022-11-08 DIAGNOSIS — L820 Inflamed seborrheic keratosis: Secondary | ICD-10-CM | POA: Diagnosis not present

## 2022-11-08 DIAGNOSIS — E559 Vitamin D deficiency, unspecified: Secondary | ICD-10-CM | POA: Diagnosis not present

## 2022-11-08 DIAGNOSIS — M79674 Pain in right toe(s): Secondary | ICD-10-CM | POA: Diagnosis not present

## 2022-11-16 DIAGNOSIS — C44622 Squamous cell carcinoma of skin of right upper limb, including shoulder: Secondary | ICD-10-CM | POA: Diagnosis not present

## 2022-11-16 DIAGNOSIS — L570 Actinic keratosis: Secondary | ICD-10-CM | POA: Diagnosis not present

## 2022-11-16 DIAGNOSIS — X32XXXD Exposure to sunlight, subsequent encounter: Secondary | ICD-10-CM | POA: Diagnosis not present

## 2022-11-20 DIAGNOSIS — M810 Age-related osteoporosis without current pathological fracture: Secondary | ICD-10-CM | POA: Diagnosis not present

## 2022-11-24 DIAGNOSIS — M5412 Radiculopathy, cervical region: Secondary | ICD-10-CM | POA: Diagnosis not present

## 2022-11-24 DIAGNOSIS — Z6826 Body mass index (BMI) 26.0-26.9, adult: Secondary | ICD-10-CM | POA: Diagnosis not present

## 2022-12-11 DIAGNOSIS — F325 Major depressive disorder, single episode, in full remission: Secondary | ICD-10-CM | POA: Diagnosis not present

## 2022-12-11 DIAGNOSIS — R32 Unspecified urinary incontinence: Secondary | ICD-10-CM | POA: Diagnosis not present

## 2022-12-11 DIAGNOSIS — H9193 Unspecified hearing loss, bilateral: Secondary | ICD-10-CM | POA: Diagnosis not present

## 2022-12-11 DIAGNOSIS — E039 Hypothyroidism, unspecified: Secondary | ICD-10-CM | POA: Diagnosis not present

## 2022-12-11 DIAGNOSIS — M1990 Unspecified osteoarthritis, unspecified site: Secondary | ICD-10-CM | POA: Diagnosis not present

## 2022-12-11 DIAGNOSIS — F411 Generalized anxiety disorder: Secondary | ICD-10-CM | POA: Diagnosis not present

## 2022-12-11 DIAGNOSIS — H3530 Unspecified macular degeneration: Secondary | ICD-10-CM | POA: Diagnosis not present

## 2022-12-11 DIAGNOSIS — M8580 Other specified disorders of bone density and structure, unspecified site: Secondary | ICD-10-CM | POA: Diagnosis not present

## 2022-12-11 DIAGNOSIS — R030 Elevated blood-pressure reading, without diagnosis of hypertension: Secondary | ICD-10-CM | POA: Diagnosis not present

## 2022-12-18 ENCOUNTER — Ambulatory Visit (INDEPENDENT_AMBULATORY_CARE_PROVIDER_SITE_OTHER): Payer: Medicare PPO | Admitting: Audiology

## 2022-12-18 ENCOUNTER — Ambulatory Visit (INDEPENDENT_AMBULATORY_CARE_PROVIDER_SITE_OTHER): Payer: Medicare PPO | Admitting: Otolaryngology

## 2022-12-18 ENCOUNTER — Encounter (INDEPENDENT_AMBULATORY_CARE_PROVIDER_SITE_OTHER): Payer: Self-pay

## 2022-12-18 VITALS — Ht 68.0 in | Wt 160.0 lb

## 2022-12-18 DIAGNOSIS — H906 Mixed conductive and sensorineural hearing loss, bilateral: Secondary | ICD-10-CM

## 2022-12-18 DIAGNOSIS — H903 Sensorineural hearing loss, bilateral: Secondary | ICD-10-CM | POA: Insufficient documentation

## 2022-12-18 DIAGNOSIS — H608X3 Other otitis externa, bilateral: Secondary | ICD-10-CM | POA: Diagnosis not present

## 2022-12-18 DIAGNOSIS — C07 Malignant neoplasm of parotid gland: Secondary | ICD-10-CM

## 2022-12-18 DIAGNOSIS — H6121 Impacted cerumen, right ear: Secondary | ICD-10-CM | POA: Diagnosis not present

## 2022-12-18 DIAGNOSIS — H6123 Impacted cerumen, bilateral: Secondary | ICD-10-CM

## 2022-12-18 NOTE — Progress Notes (Signed)
Patient ID: Maria Rogers, female   DOB: Feb 15, 1945, 77 y.o.   MRN: [id number]  Follow up: Parotid cancer, hearing loss, cerumen impaction, itchy ears  HPI: The patient is a 77 year old female who returns today for her follow-up evaluation. 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  She underwent right parotidectomy surgery.  The primary of the lesion was likely from her skin cancer.  The surgery was performed in 2016.  In addition, the patient also has a history of eczematous otitis externa, bilateral high frequency hearing loss, and recurrent cerumen impaction.  She returns today reporting increasing hearing difficulty.  She also reports clogging and itchy sensation in her ears.  No otalgia or otorrhea. No other ENT, GI, or respiratory issue noted since the last visit.   objective General: Communicates without difficulty, well nourished, no acute distress. Head: Normocephalic, no evidence injury, no tenderness, facial buttresses intact without stepoff. Face/sinus: No tenderness to palpation and percussion. Facial movement is normal and symmetric. Eyes: PERRL, EOMI. No scleral icterus, conjunctivae clear. Neuro: CN II exam reveals vision grossly intact.  No nystagmus at any point of gaze. Ears: Auricles well formed without lesions.  Right ear cerumen impaction. Nose: External evaluation reveals normal support and skin without lesions.  Dorsum is intact.  Anterior rhinoscopy reveals pink mucosa over anterior aspect of inferior turbinates and intact septum.  No purulence noted. Oral:  Oral cavity and oropharynx are intact, symmetric, without erythema or edema.  Mucosa is moist without lesions.  Neck: Full range of motion without pain. The patient's right parotid incision is well healed.  There is no significant lymphadenopathy.  No masses palpable.  Thyroid bed within normal limits to palpation.  Trachea is midline. Neuro:  CN 2-12 grossly intact. Gait  normal.  Procedure: Right ear cerumen disimpaction Anesthesia: None Description: Under the operating microscope, the cerumen is carefully removed with a combination of cerumen currette, alligator forceps, and suction catheters.  After the cerumen is removed, the TMs are noted to be normal.  Eczematous changes are noted in both ear canals.  No mass, erythema, or lesions. The patient tolerated the procedure well.   Her hearing test shows bilateral progressive high-frequency hearing loss.  assessment 1.  Right ear cerumen impaction.  After the disimpaction procedure, both tympanic membranes and middle ear spaces are noted to be normal. 2.  Bilateral chronic eczematous otitis externa.   3.  The patient's right parotid incision is well healed.  There is no evidence of recurrent disease.   4.  Bilateral progressive high-frequency hearing loss.    plan 1.  Otomicroscopy with right ear cerumen disimpaction. 2.  The physical exam findings and the hearing test results are reviewed with the patient.   3.  Elocon cream to treat the chronic eczematous otitis externa. 4.  Continue the use of her hearing aids.  Her hearing aids will need to be adjusted. 5.  The patient will return for re-evaluation in 6 months, sooner if needed.

## 2022-12-18 NOTE — Progress Notes (Signed)
  [address] [place], [place] [address] [phone]  Audiological Evaluation    Name: Maria Rogers     DOB:   December 18, 1945      MRN:   [id number]                                                                                     Service Date: 12/18/2022        Patient was referred today for a hearing evaluation by Dr. Karle Barr.   Symptoms Yes Details  Hearing loss  [x]  Patient reported perceiving a decrease in her hearing.  Tinnitus  []  Patient denied experiencing tinnitus.  Balance problems  []  Patient denied vertigo/imbalance sensations.  Family history  []  Patient denied family history of hearing loss.  Amplification  [x]  Patient reported using Oticon hearing aids bilaterally.    Tympanogram: Right ear: Normal external ear canal volume with normal middle ear pressure and low tympanic membrane compliance (Type As). Left ear: Normal external ear canal volume with normal middle ear pressure and tympanic membrane compliance (Type A).   Hearing Evaluation: The audiogram was completed using conventional audiometric techniques under headphones with good reliability.   The hearing test results indicate: Right ear: Mild hearing loss at 250 Hz gradually sloping to profound mixed hearing loss from [phone]. Left ear: Mild hearing loss at 250 Hz gradually sloping to profound mixed hearing loss from [phone].  Speech Audiometry: Right ear- Speech Reception Threshold (SRT) was obtained at 60 dBHL. Left ear- Speech Reception Threshold (SRT) was obtained at 60 dBHL.   Word Recognition Score Tested using NU-6 (MLV) Right ear: 68% was obtained at a presentation level of 85 dBHL which is deemed as fair understanding. Left ear: 64% was obtained at a presentation level of 85 dBHL which is deemed as fair understanding.    Impression:  There is not a significant difference between puretone thresholds and word recognition scores between  ears.   Recommendations: Repeat audiogram when changes are perceived or per MD. Continue use of hearing aids.   Conley Rolls Ruchama Kubicek, AUD, CCC-A 12/18/22

## 2022-12-18 NOTE — Progress Notes (Signed)
Patient was seen today for a hearing aid check. Patient currently wears bilateral Oticon More 3 hearing aids. She reported her left hearing aid was not making any sound. Replaced the receiver wire, dome, and wax filter. The listening check revealed no sound. Will send both hearing aids to the manufacturer under her warranty for a clean and check. Will call patient once the devices are back in office.   Maria Rogers, AUD, CCC-A 12/18/22

## 2022-12-21 DIAGNOSIS — D225 Melanocytic nevi of trunk: Secondary | ICD-10-CM | POA: Diagnosis not present

## 2022-12-21 DIAGNOSIS — Z1283 Encounter for screening for malignant neoplasm of skin: Secondary | ICD-10-CM | POA: Diagnosis not present

## 2022-12-21 DIAGNOSIS — L820 Inflamed seborrheic keratosis: Secondary | ICD-10-CM | POA: Diagnosis not present

## 2022-12-21 DIAGNOSIS — Z85828 Personal history of other malignant neoplasm of skin: Secondary | ICD-10-CM | POA: Diagnosis not present

## 2022-12-21 DIAGNOSIS — L570 Actinic keratosis: Secondary | ICD-10-CM | POA: Diagnosis not present

## 2022-12-21 DIAGNOSIS — X32XXXD Exposure to sunlight, subsequent encounter: Secondary | ICD-10-CM | POA: Diagnosis not present

## 2022-12-21 DIAGNOSIS — Z08 Encounter for follow-up examination after completed treatment for malignant neoplasm: Secondary | ICD-10-CM | POA: Diagnosis not present

## 2022-12-27 ENCOUNTER — Ambulatory Visit (INDEPENDENT_AMBULATORY_CARE_PROVIDER_SITE_OTHER): Payer: Medicare PPO | Admitting: Audiology

## 2022-12-27 NOTE — Progress Notes (Signed)
Patient's hearing aids were returned to the office from the manufacturer. O[employer]on completely replaced her devices. The new serial numbers are [id number] (R) and [id number] (L). Connected both hearing aids to the software and uploaded her previous settings. Will call patient to come pick up the repaired devices.   Conley Rolls Khriz Liddy, AUD, CCC-A 12/27/22

## 2023-01-02 ENCOUNTER — Ambulatory Visit (INDEPENDENT_AMBULATORY_CARE_PROVIDER_SITE_OTHER): Payer: Medicare PPO | Admitting: Audiology

## 2023-01-02 NOTE — Progress Notes (Signed)
Patient was seen today to pickup her repaired hearing aids from the manufacturer. Reconnected the hearing aids to her phone Bluetooth and the [employer]. Discussed the app features and trialed a phone call in the office. She was happy with the connections. Instructed her to call if any concerns arise.   Conley Rolls Marra Fraga, AUD, CCC-A 01/02/23

## 2023-03-09 ENCOUNTER — Other Ambulatory Visit: Payer: Self-pay | Admitting: Neurological Surgery

## 2023-03-09 NOTE — Pre-Procedure Instructions (Signed)
Surgical Instructions   Your procedure is scheduled on March 15, 2023. Report to [facility] Main Entrance "A" at 12:30 P.M., then check in with the Admitting office. Any questions or running late day of surgery: call [phone]  Questions prior to your surgery date: call [phone], Monday-Friday, 8am-4pm. If you experience any cold or flu symptoms such as cough, fever, chills, shortness of breath, etc. between now and your scheduled surgery, please notify us at the above number.     Remember:  Do not eat after midnight the night before your surgery  You may drink clear liquids until 11:30 AM the morning of your surgery.   Clear liquids allowed are: Water, Non-Citrus Juices (without pulp), Carbonated Beverages, Clear Tea (no milk, honey, etc.), Black Coffee Only (NO MILK, CREAM OR POWDERED CREAMER of any kind), and Gatorade.    Take these medicines the morning of surgery with A SIP OF WATER: levothyroxine (SYNTHROID, LEVOTHROID)  venlafaxine XR [facility])    May take these medicines IF NEEDED: acetaminophen (TYLENOL)    One week prior to surgery, STOP taking any Aspirin (unless otherwise instructed by your surgeon) Aleve, Naproxen, Ibuprofen, Motrin, Advil, Goody's, BC's, all herbal medications, fish oil, and non-prescription vitamins.                     Do NOT Smoke (Tobacco/Vaping) for 24 hours prior to your procedure.  If you use a CPAP at night, you may bring your mask/headgear for your overnight stay.   You will be asked to remove any contacts, glasses, piercing's, hearing aid's, dentures/partials prior to surgery. Please bring cases for these items if needed.    Patients discharged the day of surgery will not be allowed to drive home, and someone needs to stay with them for 24 hours.  SURGICAL WAITING ROOM VISITATION Patients may have no more than 2 support people in the waiting area - these visitors may rotate.   Pre-op nurse will coordinate an appropriate time  for 1 ADULT support person, who may not rotate, to accompany patient in pre-op.  Children under the age of 75 must have an adult with them who is not the patient and must remain in the main waiting area with an adult.  If the patient needs to stay at the hospital during part of their recovery, the visitor guidelines for inpatient rooms apply.  Please refer to the [facility] website for the visitor guidelines for any additional information.   If you received a COVID test during your pre-op visit  it is requested that you wear a mask when out in public, stay away from anyone that may not be feeling well and notify your surgeon if you develop symptoms. If you have been in contact with anyone that has tested positive in the last 10 days please notify you surgeon.      Pre-operative 5 CHG Bathing Instructions   You can play a key role in reducing the risk of infection after surgery. Your skin needs to be as free of germs as possible. You can reduce the number of germs on your skin by washing with CHG (chlorhexidine gluconate) soap before surgery. CHG is an antiseptic soap that kills germs and continues to kill germs even after washing.   DO NOT use if you have an allergy to chlorhexidine/CHG or antibacterial soaps. If your skin becomes reddened or irritated, stop using the CHG and notify one of our RNs at [phone].   Please shower with the CHG  soap starting 4 days before surgery using the following schedule:     Please keep in mind the following:  DO NOT shave, including legs and underarms, starting the day of your first shower.   You may shave your face at any point before/day of surgery.  Place clean sheets on your bed the day you start using CHG soap. Use a clean washcloth (not used since being washed) for each shower. DO NOT sleep with pets once you start using the CHG.   CHG Shower Instructions:  Wash your face and private area with normal soap. If you choose to wash your hair,  wash first with your normal shampoo.  After you use shampoo/soap, rinse your hair and body thoroughly to remove shampoo/soap residue.  Turn the water OFF and apply about 3 tablespoons (45 ml) of CHG soap to a CLEAN washcloth.  Apply CHG soap ONLY FROM YOUR NECK DOWN TO YOUR TOES (washing for 3-5 minutes)  DO NOT use CHG soap on face, private areas, open wounds, or sores.  Pay special attention to the area where your surgery is being performed.  If you are having back surgery, having someone wash your back for you may be helpful. Wait 2 minutes after CHG soap is applied, then you may rinse off the CHG soap.  Pat dry with a clean towel  Put on clean clothes/pajamas   If you choose to wear lotion, please use ONLY the CHG-compatible lotions that are listed below.  Additional instructions for the day of surgery: DO NOT APPLY any lotions, deodorants, cologne, or perfumes.   Do not bring valuables to the hospital. [facility] is not responsible for any belongings/valuables. Do not wear nail polish, gel polish, artificial nails, or any other type of covering on natural nails (fingers and toes) Do not wear jewelry or makeup Put on clean/comfortable clothes.  Please brush your teeth.  Ask your nurse before applying any prescription medications to the skin.     CHG Compatible Lotions   Aveeno Moisturizing lotion  Cetaphil Moisturizing Cream  Cetaphil Moisturizing Lotion  Clairol Herbal Essence Moisturizing Lotion, Dry Skin  Clairol Herbal Essence Moisturizing Lotion, Extra Dry Skin  Clairol Herbal Essence Moisturizing Lotion, Normal Skin  Curel Age Defying Therapeutic Moisturizing Lotion with Alpha Hydroxy  Curel Extreme Care Body Lotion  Curel Soothing Hands Moisturizing Hand Lotion  Curel Therapeutic Moisturizing Cream, Fragrance-Free  Curel Therapeutic Moisturizing Lotion, Fragrance-Free  Curel Therapeutic Moisturizing Lotion, Original Formula  Eucerin Daily Replenishing Lotion   Eucerin Dry Skin Therapy Plus Alpha Hydroxy Crme  Eucerin Dry Skin Therapy Plus Alpha Hydroxy Lotion  Eucerin Original Crme  Eucerin Original Lotion  Eucerin Plus Crme Eucerin Plus Lotion  Eucerin TriLipid Replenishing Lotion  Keri Anti-Bacterial Hand Lotion  Keri Deep Conditioning Original Lotion Dry Skin Formula Softly Scented  Keri Deep Conditioning Original Lotion, Fragrance Free Sensitive Skin Formula  Keri Lotion Fast Absorbing Fragrance Free Sensitive Skin Formula  Keri Lotion Fast Absorbing Softly Scented Dry Skin Formula  Keri Original Lotion  Keri Skin Renewal Lotion Keri Silky Smooth Lotion  Keri Silky Smooth Sensitive Skin Lotion  Nivea Body Creamy Conditioning Oil  Nivea Body Extra Enriched Teacher, adult education Moisturizing Lotion Nivea Crme  Nivea Skin Firming Lotion  NutraDerm 30 Skin Lotion  NutraDerm Skin Lotion  NutraDerm Therapeutic Skin Cream  NutraDerm Therapeutic Skin Lotion  ProShield Protective Hand Cream  Provon moisturizing lotion  Please read over the following fact sheets  that you were given.

## 2023-03-09 NOTE — Progress Notes (Signed)
Surgical orders requested from Dr. Dalphine Handing office

## 2023-03-12 ENCOUNTER — Encounter (HOSPITAL_COMMUNITY)
Admission: RE | Admit: 2023-03-12 | Discharge: 2023-03-12 | Disposition: A | Discharge status: Home / Self Care | Payer: Medicare PPO | Source: Ambulatory Visit | Attending: Neurological Surgery | Admitting: Neurological Surgery

## 2023-03-12 ENCOUNTER — Other Ambulatory Visit: Payer: Self-pay

## 2023-03-12 ENCOUNTER — Encounter (HOSPITAL_COMMUNITY): Payer: Self-pay

## 2023-03-12 VITALS — BP 162/88 | HR 74 | Temp 98.3°F | Resp 18 | Ht 68.0 in | Wt 162.3 lb

## 2023-03-12 DIAGNOSIS — H6121 Impacted cerumen, right ear: Secondary | ICD-10-CM

## 2023-03-12 DIAGNOSIS — Z01812 Encounter for preprocedural laboratory examination: Secondary | ICD-10-CM | POA: Diagnosis not present

## 2023-03-12 DIAGNOSIS — Z01818 Encounter for other preprocedural examination: Secondary | ICD-10-CM

## 2023-03-12 LAB — CBC
HCT: 36.8 % (ref 36.0–46.0)
Hemoglobin: 12 g/dL (ref 12.0–15.0)
MCH: 29.9 pg (ref 26.0–34.0)
MCHC: 32.6 g/dL (ref 30.0–36.0)
MCV: 91.5 fL (ref 80.0–100.0)
Platelets: 266 10*3/uL (ref 150–400)
RBC: 4.02 MIL/uL (ref 3.87–5.11)
RDW: 13.2 % (ref 11.5–15.5)
WBC: 3.3 10*3/uL — ABNORMAL LOW (ref 4.0–10.5)
nRBC: 0 % (ref 0.0–0.2)

## 2023-03-12 LAB — SURGICAL PCR SCREEN
MRSA, PCR: NEGATIVE
Staphylococcus aureus: NEGATIVE

## 2023-03-12 NOTE — Progress Notes (Signed)
PCP -  Dr. Donzetta Sprung Cardiologist - Denies  PPM/ICD - Denies Device Orders - n/a Rep Notified - n/a  Chest x-ray - n/a EKG - n/a Stress Test - Denies  ECHO - Denies Cardiac Cath - Denies  Sleep Study - Denies CPAP - n/a  NON-diabetic  Blood Thinner Instructions: Denies Aspirin Instructions: Denies  ERAS Protcol - ERAS till 1130 PRE-SURGERY Ensure or G2- none   COVID TEST- N   Anesthesia review: No   Patient denies shortness of breath, fever, cough and chest pain at PAT appointment. Patient denies any respiratory issues at this time.    All instructions explained to the patient, with a verbal understanding of the material. Patient agrees to go over the instructions while at home for a better understanding. Patient also instructed to self quarantine after being tested for COVID-19. The opportunity to ask questions was provided.

## 2023-03-14 NOTE — Progress Notes (Signed)
Patient was called to be informed that the surgery time for tomorrow was changed to 15:34 o'clock. Patient was instructed to be at the hospital at 13:30 o'clock and stop drinking clear liquids at 12:30 o'clock. Patient verbalized understanding.

## 2023-03-15 ENCOUNTER — Ambulatory Visit (HOSPITAL_COMMUNITY): Payer: Medicare PPO

## 2023-03-15 ENCOUNTER — Observation Stay (HOSPITAL_COMMUNITY)
Admission: RE | Admit: 2023-03-15 | Discharge: 2023-03-16 | Disposition: A | Discharge status: Home / Self Care | Payer: Medicare PPO | Attending: Neurological Surgery | Admitting: Neurological Surgery

## 2023-03-15 ENCOUNTER — Other Ambulatory Visit: Payer: Self-pay

## 2023-03-15 ENCOUNTER — Encounter (HOSPITAL_COMMUNITY)
Admission: RE | Disposition: A | Discharge status: Home / Self Care | Payer: Self-pay | Source: Home / Self Care | Attending: Neurological Surgery

## 2023-03-15 ENCOUNTER — Ambulatory Visit (HOSPITAL_BASED_OUTPATIENT_CLINIC_OR_DEPARTMENT_OTHER): Payer: Medicare PPO

## 2023-03-15 ENCOUNTER — Encounter (HOSPITAL_COMMUNITY): Payer: Self-pay | Admitting: Neurological Surgery

## 2023-03-15 DIAGNOSIS — E039 Hypothyroidism, unspecified: Secondary | ICD-10-CM | POA: Diagnosis not present

## 2023-03-15 DIAGNOSIS — M5412 Radiculopathy, cervical region: Secondary | ICD-10-CM | POA: Diagnosis not present

## 2023-03-15 DIAGNOSIS — Z96651 Presence of right artificial knee joint: Secondary | ICD-10-CM | POA: Insufficient documentation

## 2023-03-15 DIAGNOSIS — M4722 Other spondylosis with radiculopathy, cervical region: Principal | ICD-10-CM | POA: Diagnosis present

## 2023-03-15 DIAGNOSIS — Z79899 Other long term (current) drug therapy: Secondary | ICD-10-CM | POA: Insufficient documentation

## 2023-03-15 DIAGNOSIS — Z85828 Personal history of other malignant neoplasm of skin: Secondary | ICD-10-CM | POA: Diagnosis not present

## 2023-03-15 DIAGNOSIS — M4802 Spinal stenosis, cervical region: Secondary | ICD-10-CM | POA: Diagnosis not present

## 2023-03-15 HISTORY — PX: POSTERIOR CERVICAL LAMINECTOMY WITH MET- RX: SHX6035

## 2023-03-15 LAB — CBC
HCT: 38.4 % (ref 36.0–46.0)
Hemoglobin: 12.5 g/dL (ref 12.0–15.0)
MCH: 29.7 pg (ref 26.0–34.0)
MCHC: 32.6 g/dL (ref 30.0–36.0)
MCV: 91.2 fL (ref 80.0–100.0)
Platelets: 258 10*3/uL (ref 150–400)
RBC: 4.21 MIL/uL (ref 3.87–5.11)
RDW: 13.2 % (ref 11.5–15.5)
WBC: 7.3 10*3/uL (ref 4.0–10.5)
nRBC: 0 % (ref 0.0–0.2)

## 2023-03-15 LAB — CREATININE, SERUM
Creatinine, Ser: 0.91 mg/dL (ref 0.44–1.00)
GFR, Estimated: >60 mL/min (ref >60)

## 2023-03-15 SURGERY — POSTERIOR CERVICAL LAMINECTOMY WITH MET- RX
Anesthesia: General | Site: Back | Laterality: Right

## 2023-03-15 MED ORDER — SENNA 8.6 MG PO TABS
1.0000 | ORAL_TABLET | Freq: Two times a day (BID) | ORAL | Status: DC
  Administered 2023-03-15 – 2023-03-16 (×2): 8.6 mg via ORAL
  Filled 2023-03-15 (×2): qty 1

## 2023-03-15 MED ORDER — ACETAMINOPHEN 650 MG RE SUPP: 650.0000 mg | RECTAL | Status: DC | PRN

## 2023-03-15 MED ORDER — ONDANSETRON HCL 4 MG/2ML IJ SOLN
INTRAMUSCULAR | Status: AC
  Filled 2023-03-15: qty 2

## 2023-03-15 MED ORDER — OXYCODONE-ACETAMINOPHEN 5-325 MG PO TABS: 1.0000 | ORAL_TABLET | Freq: Four times a day (QID) | ORAL | Status: DC | PRN

## 2023-03-15 MED ORDER — BISACODYL 10 MG RE SUPP: 10.0000 mg | Freq: Every day | RECTAL | Status: DC | PRN

## 2023-03-15 MED ORDER — PROPOFOL 10 MG/ML IV BOLUS
INTRAVENOUS | Status: DC | PRN
  Administered 2023-03-15: 120 mg via INTRAVENOUS

## 2023-03-15 MED ORDER — VENLAFAXINE HCL ER 75 MG PO CP24
75.0000 mg | ORAL_CAPSULE | Freq: Two times a day (BID) | ORAL | Status: DC
Start: 2023-03-15 — End: 2023-03-16
  Administered 2023-03-15 – 2023-03-16 (×2): 75 mg via ORAL
  Filled 2023-03-15 (×2): qty 1

## 2023-03-15 MED ORDER — LIDOCAINE 2% (20 MG/ML) 5 ML SYRINGE
INTRAMUSCULAR | Status: DC | PRN
  Administered 2023-03-15: 80 mg via INTRAVENOUS

## 2023-03-15 MED ORDER — LIDOCAINE 2% (20 MG/ML) 5 ML SYRINGE
INTRAMUSCULAR | Status: AC
  Filled 2023-03-15: qty 5

## 2023-03-15 MED ORDER — CHLORHEXIDINE GLUCONATE 0.12 % MT SOLN: 15.0000 mL | Freq: Once | OROMUCOSAL | Status: AC

## 2023-03-15 MED ORDER — MENTHOL 3 MG MT LOZG: 1.0000 | LOZENGE | OROMUCOSAL | Status: DC | PRN

## 2023-03-15 MED ORDER — METHOCARBAMOL 1000 MG/10ML IJ SOLN: 500.0000 mg | Freq: Four times a day (QID) | INTRAMUSCULAR | Status: DC | PRN

## 2023-03-15 MED ORDER — METHOCARBAMOL 500 MG PO TABS: 500.0000 mg | ORAL_TABLET | Freq: Four times a day (QID) | ORAL | Status: DC | PRN

## 2023-03-15 MED ORDER — CEFAZOLIN SODIUM-DEXTROSE 2-4 GM/100ML-% IV SOLN
2.0000 g | Freq: Three times a day (TID) | INTRAVENOUS | Status: AC
  Administered 2023-03-15 – 2023-03-16 (×2): 2 g via INTRAVENOUS
  Filled 2023-03-15 (×2): qty 100

## 2023-03-15 MED ORDER — FENTANYL CITRATE (PF) 250 MCG/5ML IJ SOLN
INTRAMUSCULAR | Status: AC
  Filled 2023-03-15: qty 5

## 2023-03-15 MED ORDER — SODIUM CHLORIDE 0.9% FLUSH
3.0000 mL | Freq: Two times a day (BID) | INTRAVENOUS | Status: DC
  Administered 2023-03-15: 3 mL via INTRAVENOUS

## 2023-03-15 MED ORDER — 0.9 % SODIUM CHLORIDE (POUR BTL) OPTIME
TOPICAL | Status: DC | PRN
  Administered 2023-03-15: 1000 mL

## 2023-03-15 MED ORDER — AMISULPRIDE (ANTIEMETIC) 5 MG/2ML IV SOLN: 10.0000 mg | Freq: Once | INTRAVENOUS | Status: DC | PRN

## 2023-03-15 MED ORDER — BACITRACIN ZINC 500 UNIT/GM EX OINT
TOPICAL_OINTMENT | CUTANEOUS | Status: DC | PRN
  Administered 2023-03-15: 1 via TOPICAL

## 2023-03-15 MED ORDER — SODIUM CHLORIDE 0.9% FLUSH: 3.0000 mL | INTRAVENOUS | Status: DC | PRN

## 2023-03-15 MED ORDER — ENOXAPARIN SODIUM 40 MG/0.4ML IJ SOSY
40.0000 mg | PREFILLED_SYRINGE | INTRAMUSCULAR | Status: DC
Start: 2023-03-16 — End: 2023-03-16
  Administered 2023-03-16: 40 mg via SUBCUTANEOUS
  Filled 2023-03-15: qty 0.4

## 2023-03-15 MED ORDER — SODIUM CHLORIDE 0.9 % IV SOLN: 12.5000 mg | INTRAVENOUS | Status: DC | PRN

## 2023-03-15 MED ORDER — BACITRACIN ZINC 500 UNIT/GM EX OINT
TOPICAL_OINTMENT | CUTANEOUS | Status: AC
  Filled 2023-03-15: qty 28.35

## 2023-03-15 MED ORDER — PHENYLEPHRINE 80 MCG/ML (10ML) SYRINGE FOR IV PUSH (FOR BLOOD PRESSURE SUPPORT)
PREFILLED_SYRINGE | INTRAVENOUS | Status: DC | PRN
  Administered 2023-03-15: 240 ug via INTRAVENOUS
  Administered 2023-03-15: 80 ug via INTRAVENOUS
  Administered 2023-03-15: 160 ug via INTRAVENOUS
  Administered 2023-03-15: 80 ug via INTRAVENOUS

## 2023-03-15 MED ORDER — HYDROMORPHONE HCL 1 MG/ML IJ SOLN
0.2500 mg | INTRAMUSCULAR | Status: DC | PRN
  Administered 2023-03-15 (×2): 0.5 mg via INTRAVENOUS

## 2023-03-15 MED ORDER — PHENOL 1.4 % MT LIQD: 1.0000 | OROMUCOSAL | Status: DC | PRN

## 2023-03-15 MED ORDER — BUPIVACAINE HCL (PF) 0.5 % IJ SOLN
INTRAMUSCULAR | Status: DC | PRN
  Administered 2023-03-15: 10 mL
  Administered 2023-03-15: 4 mL

## 2023-03-15 MED ORDER — GLYCOPYRROLATE PF 0.2 MG/ML IJ SOSY
PREFILLED_SYRINGE | INTRAMUSCULAR | Status: AC
  Filled 2023-03-15: qty 1

## 2023-03-15 MED ORDER — HYDROMORPHONE HCL 1 MG/ML IJ SOLN: 0.5000 mg | INTRAMUSCULAR | Status: DC | PRN

## 2023-03-15 MED ORDER — OXYCODONE HCL 5 MG/5ML PO SOLN: 5.0000 mg | Freq: Once | ORAL | Status: DC | PRN

## 2023-03-15 MED ORDER — CEFAZOLIN SODIUM-DEXTROSE 2-4 GM/100ML-% IV SOLN
2.0000 g | INTRAVENOUS | Status: AC
  Administered 2023-03-15: 2 g via INTRAVENOUS

## 2023-03-15 MED ORDER — ACETAMINOPHEN 325 MG PO TABS
650.0000 mg | ORAL_TABLET | ORAL | Status: DC | PRN
  Administered 2023-03-16 (×2): 650 mg via ORAL
  Filled 2023-03-15 (×2): qty 2

## 2023-03-15 MED ORDER — CEFAZOLIN SODIUM-DEXTROSE 2-4 GM/100ML-% IV SOLN
INTRAVENOUS | Status: AC
  Filled 2023-03-15: qty 100

## 2023-03-15 MED ORDER — PROPOFOL 10 MG/ML IV BOLUS
INTRAVENOUS | Status: AC
  Filled 2023-03-15: qty 20

## 2023-03-15 MED ORDER — DEXAMETHASONE SODIUM PHOSPHATE 10 MG/ML IJ SOLN
INTRAMUSCULAR | Status: AC
  Filled 2023-03-15: qty 1

## 2023-03-15 MED ORDER — LACTATED RINGERS IV SOLN: INTRAVENOUS | Status: DC

## 2023-03-15 MED ORDER — DEXAMETHASONE SODIUM PHOSPHATE 10 MG/ML IJ SOLN
INTRAMUSCULAR | Status: DC | PRN
  Administered 2023-03-15: 10 mg via INTRAVENOUS

## 2023-03-15 MED ORDER — CHLORHEXIDINE GLUCONATE CLOTH 2 % EX PADS: 6.0000 | MEDICATED_PAD | Freq: Once | CUTANEOUS | Status: DC

## 2023-03-15 MED ORDER — ALUM & MAG HYDROXIDE-SIMETH 200-200-20 MG/5ML PO SUSP
30.0000 mL | Freq: Four times a day (QID) | ORAL | Status: DC | PRN
Start: 2023-03-15 — End: 2023-03-16

## 2023-03-15 MED ORDER — BUPIVACAINE HCL (PF) 0.5 % IJ SOLN
INTRAMUSCULAR | Status: AC
Start: 2023-03-15 — End: ?
  Filled 2023-03-15: qty 30

## 2023-03-15 MED ORDER — DOCUSATE SODIUM 100 MG PO CAPS
100.0000 mg | ORAL_CAPSULE | Freq: Two times a day (BID) | ORAL | Status: DC
  Administered 2023-03-15 – 2023-03-16 (×2): 100 mg via ORAL
  Filled 2023-03-15 (×2): qty 1

## 2023-03-15 MED ORDER — ROCURONIUM BROMIDE 10 MG/ML (PF) SYRINGE
PREFILLED_SYRINGE | INTRAVENOUS | Status: AC
  Filled 2023-03-15: qty 10

## 2023-03-15 MED ORDER — EPHEDRINE 5 MG/ML INJ
INTRAVENOUS | Status: AC
  Filled 2023-03-15: qty 5

## 2023-03-15 MED ORDER — PHENYLEPHRINE 80 MCG/ML (10ML) SYRINGE FOR IV PUSH (FOR BLOOD PRESSURE SUPPORT)
PREFILLED_SYRINGE | INTRAVENOUS | Status: AC
  Filled 2023-03-15: qty 10

## 2023-03-15 MED ORDER — ONDANSETRON HCL 4 MG/2ML IJ SOLN
INTRAMUSCULAR | Status: DC | PRN
  Administered 2023-03-15: 4 mg via INTRAVENOUS

## 2023-03-15 MED ORDER — EPHEDRINE SULFATE-NACL 50-0.9 MG/10ML-% IV SOSY
PREFILLED_SYRINGE | INTRAVENOUS | Status: DC | PRN
  Administered 2023-03-15: 5 mg via INTRAVENOUS
  Administered 2023-03-15: 10 mg via INTRAVENOUS

## 2023-03-15 MED ORDER — LIDOCAINE-EPINEPHRINE 1 %-1:100000 IJ SOLN
INTRAMUSCULAR | Status: DC | PRN
  Administered 2023-03-15: 4 mL

## 2023-03-15 MED ORDER — THROMBIN 5000 UNITS EX SOLR
CUTANEOUS | Status: AC
  Filled 2023-03-15: qty 5000

## 2023-03-15 MED ORDER — LEVOTHYROXINE SODIUM 112 MCG PO TABS
112.0000 ug | ORAL_TABLET | Freq: Every day | ORAL | Status: DC
  Administered 2023-03-16: 112 ug via ORAL
  Filled 2023-03-15: qty 1

## 2023-03-15 MED ORDER — FLEET ENEMA RE ENEM: 1.0000 | ENEMA | Freq: Once | RECTAL | Status: DC | PRN

## 2023-03-15 MED ORDER — SUGAMMADEX SODIUM 200 MG/2ML IV SOLN
INTRAVENOUS | Status: DC | PRN
  Administered 2023-03-15: 200 mg via INTRAVENOUS

## 2023-03-15 MED ORDER — HYDROMORPHONE HCL 1 MG/ML IJ SOLN
INTRAMUSCULAR | Status: AC
  Filled 2023-03-15: qty 1

## 2023-03-15 MED ORDER — ORAL CARE MOUTH RINSE: 15.0000 mL | Freq: Once | OROMUCOSAL | Status: AC

## 2023-03-15 MED ORDER — GLYCOPYRROLATE 0.2 MG/ML IJ SOLN
INTRAMUSCULAR | Status: DC | PRN
  Administered 2023-03-15: .2 mg via INTRAVENOUS

## 2023-03-15 MED ORDER — ROCURONIUM BROMIDE 10 MG/ML (PF) SYRINGE
PREFILLED_SYRINGE | INTRAVENOUS | Status: DC | PRN
  Administered 2023-03-15: 70 mg via INTRAVENOUS

## 2023-03-15 MED ORDER — CHLORHEXIDINE GLUCONATE CLOTH 2 % EX PADS
6.0000 | MEDICATED_PAD | Freq: Once | CUTANEOUS | Status: DC
Start: 2023-03-15 — End: 2023-03-15

## 2023-03-15 MED ORDER — THROMBIN 5000 UNITS EX SOLR: OROMUCOSAL | Status: DC | PRN

## 2023-03-15 MED ORDER — LIDOCAINE-EPINEPHRINE 1 %-1:100000 IJ SOLN
INTRAMUSCULAR | Status: AC
  Filled 2023-03-15: qty 1

## 2023-03-15 MED ORDER — CHLORHEXIDINE GLUCONATE 0.12 % MT SOLN
OROMUCOSAL | Status: AC
  Administered 2023-03-15: 15 mL via OROMUCOSAL
  Filled 2023-03-15: qty 15

## 2023-03-15 MED ORDER — DEXMEDETOMIDINE HCL IN NACL 80 MCG/20ML IV SOLN
INTRAVENOUS | Status: AC
  Filled 2023-03-15: qty 20

## 2023-03-15 MED ORDER — ONDANSETRON HCL 4 MG/2ML IJ SOLN: 4.0000 mg | Freq: Four times a day (QID) | INTRAMUSCULAR | Status: DC | PRN

## 2023-03-15 MED ORDER — ONDANSETRON HCL 4 MG PO TABS: 4.0000 mg | ORAL_TABLET | Freq: Four times a day (QID) | ORAL | Status: DC | PRN

## 2023-03-15 MED ORDER — POLYETHYLENE GLYCOL 3350 17 G PO PACK: 17.0000 g | PACK | Freq: Every day | ORAL | Status: DC | PRN

## 2023-03-15 MED ORDER — SODIUM CHLORIDE 0.9 % IV SOLN
250.0000 mL | INTRAVENOUS | Status: DC
  Administered 2023-03-15: 250 mL via INTRAVENOUS

## 2023-03-15 MED ORDER — OXYCODONE HCL 5 MG PO TABS: 5.0000 mg | ORAL_TABLET | Freq: Once | ORAL | Status: DC | PRN

## 2023-03-15 MED ORDER — FENTANYL CITRATE (PF) 250 MCG/5ML IJ SOLN
INTRAMUSCULAR | Status: DC | PRN
  Administered 2023-03-15: 50 ug via INTRAVENOUS
  Administered 2023-03-15: 100 ug via INTRAVENOUS

## 2023-03-15 SURGICAL SUPPLY — 48 items
BAG COUNTER SPONGE SURGICOUNT (BAG) ×2 IMPLANT
BAND RUBBER #18 3X1/16 STRL (MISCELLANEOUS) ×4 IMPLANT
BLADE CLIPPER SURG (BLADE) IMPLANT
BLADE SURG 15 STRL LF DISP TIS (BLADE) ×2 IMPLANT
BUR SURG MATCH HEAD 2.5X12.5 (BUR) IMPLANT
BUR SURGICAL HEAD PROX 3X12.5 (BURR) ×2 IMPLANT
BURR SURG MATCH HEAD 2.5X12.5 (BUR)
BURR SURGICAL HEAD PROX 3X12.5 (BURR) ×1
CANISTER SUCT 3000ML PPV (MISCELLANEOUS) ×2 IMPLANT
COVER MAYO STAND STRL (DRAPES) IMPLANT
DERMABOND ADVANCED .7 DNX12 (GAUZE/BANDAGES/DRESSINGS) ×4 IMPLANT
DRAPE C-ARM 42X72 X-RAY (DRAPES) IMPLANT
DRAPE LAPAROTOMY 100X72 PEDS (DRAPES) ×2 IMPLANT
DRAPE MICROSCOPE SLANT 54X150 (MISCELLANEOUS) ×2 IMPLANT
DURAPREP 6ML APPLICATOR 50/CS (WOUND CARE) ×2 IMPLANT
ELECT BLADE 4.0 EZ CLEAN MEGAD (MISCELLANEOUS) ×1
ELECT REM PT RETURN 9FT ADLT (ELECTROSURGICAL) ×1
ELECTRODE BLDE 4.0 EZ CLN MEGD (MISCELLANEOUS) ×2 IMPLANT
ELECTRODE REM PT RTRN 9FT ADLT (ELECTROSURGICAL) ×2 IMPLANT
GAUZE 4X4 16PLY ~~LOC~~+RFID DBL (SPONGE) IMPLANT
GLOVE BIOGEL PI IND STRL 8.5 (GLOVE) ×2 IMPLANT
GLOVE ECLIPSE 8.5 STRL (GLOVE) ×2 IMPLANT
GLOVE EXAM NITRILE XL STR (GLOVE) IMPLANT
GOWN STRL REUS W/ TWL LRG LVL3 (GOWN DISPOSABLE) IMPLANT
GOWN STRL REUS W/ TWL XL LVL3 (GOWN DISPOSABLE) IMPLANT
GOWN STRL REUS W/TWL 2XL LVL3 (GOWN DISPOSABLE) ×2 IMPLANT
HEMOSTAT POWDER KIT SURGIFOAM (HEMOSTASIS) ×2 IMPLANT
KIT BASIN OR (CUSTOM PROCEDURE TRAY) ×2 IMPLANT
KIT TURNOVER KIT B (KITS) ×2 IMPLANT
NDL HYPO 18GX1.5 BLUNT FILL (NEEDLE) IMPLANT
NDL HYPO 22X1.5 SAFETY MO (MISCELLANEOUS) ×2 IMPLANT
NDL SPNL 20GX3.5 QUINCKE YW (NEEDLE) IMPLANT
NEEDLE HYPO 18GX1.5 BLUNT FILL (NEEDLE)
NEEDLE HYPO 22X1.5 SAFETY MO (MISCELLANEOUS) ×1
NEEDLE SPNL 20GX3.5 QUINCKE YW (NEEDLE)
NS IRRIG 1000ML POUR BTL (IV SOLUTION) ×2 IMPLANT
PACK LAMINECTOMY NEURO (CUSTOM PROCEDURE TRAY) ×2 IMPLANT
PAD ARMBOARD 7.5X6 YLW CONV (MISCELLANEOUS) ×6 IMPLANT
PIN MAYFIELD SKULL DISP (PIN) ×2 IMPLANT
SPIKE FLUID TRANSFER (MISCELLANEOUS) ×2 IMPLANT
SPONGE SURGIFOAM ABS GEL SZ50 (HEMOSTASIS) IMPLANT
SUT VIC AB 3-0 SH 8-18 (SUTURE) ×2 IMPLANT
SUT VIC AB 4-0 RB1 18 (SUTURE) ×2 IMPLANT
SYR 5ML LL (SYRINGE) IMPLANT
TOWEL GREEN STERILE (TOWEL DISPOSABLE) ×2 IMPLANT
TOWEL GREEN STERILE FF (TOWEL DISPOSABLE) ×2 IMPLANT
TUBING FEATHERFLOW (TUBING) ×2 IMPLANT
WATER STERILE IRR 1000ML POUR (IV SOLUTION) ×2 IMPLANT

## 2023-03-15 NOTE — H&P (Signed)
Maria Rogers is an 78 y.o. female.   Chief Complaint: Neck shoulder and right arm pain weakness. HPI: Patient is a 78 year old right-handed individual whose had a previous herniated nucleus pulposus C6-C7.  Over the last 6 to 7 months time she started develop pain in the right shoulder and right arm.  Workup included a CT and an MRI which demonstrated presence of significant hypertrophy of the interspinous ligament off to the right side at the C4-C5 level creating C5 compression the disc at C4-5 is moderately degenerated.  Given the fact that this has been refractory to conservative treatment we discussed posterior cervical decompression at C4-C5 via laminotomy and foraminotomies.  Past Medical History:  Diagnosis Date   Arthritis    Atypical mole 04/05/1993   right mid back slight no tx   Atypical nevi 01/04/2000   right outer shoulder slight/mod. tx wider shave   Atypical nevi 09/02/2014   left cheek lateral squamous prolif. TX aldara   Basal cell carcinoma 01/08/1996   POST LEFT SHOULDER BCC SUP. TX EDC X3   BCC (basal cell carcinoma of skin) 01/08/1996   RIGHT UPPER BACK BCC SUP. TX EDC X3   BCC (basal cell carcinoma of skin) 11/10/1998   RIGHT SHOULDER ANTERIOR BCC MORPHEA TX CURET X3 +MARGIN =EXC   BCC (basal cell carcinoma of skin) 11/10/1998   UPPER MID BACK SUPERIOR BCC SUPERFICIAL TX CX3 5FU   BCC (basal cell carcinoma of skin) 11/10/1998   UPPER MID BACK INFERIOR BCC TX CX3 5FU   BCC (basal cell carcinoma of skin) 11/10/1998   JUNCTION LEFT EAR LOBULE BOWENS TX CX3 5FU   BCC (basal cell carcinoma of skin) 06/12/1998   UPPER LEFT BACK SUPERIOR BCC TX CX3 5FU   BCC (basal cell carcinoma of skin) 06/18/2007   UPPER LEFT BACK MEDIAL BCC TX CX3 5FU   BCC (basal cell carcinoma of skin) 06/18/2007   RIGHT UPPER BACK BCC TX CX3 5FU   BCC (basal cell carcinoma of skin) 04/07/2009   RIGHT CHEEK BCC TX CX3 5FU   BCC (basal cell carcinoma of skin) 05/18/2009   LEFT CHEEK BOWENS  TX WITH BX   BCC (basal cell carcinoma of skin) 05/26/2014   CROWN BCC NOD. TX CX3 5FU   BCC (basal cell carcinoma of skin) 10/01/2018   LEFT POST. SHOULDER BCC SUPERFICAIL TX CX3 5FU   Cancer (HCC)    cancer of parotid gland / hx skin cancer   Carpal tunnel syndrome, bilateral 09/13/2015   Depression    History of skin cancer    Hypothyroidism    SCC (squamous cell carcinoma) 04/07/2009   CHEST SCCA IN SITU TX CX3 5FU   SCC (squamous cell carcinoma) 09/02/2014   LEFT CHEEK MEDIAL SCCA IN SITU TX ALDARA    SCC (squamous cell carcinoma) 01/14/2015   LEFT UPPER LIP  SCC TX MOHS   SCC (squamous cell carcinoma) 10/30/2016   LEFT UPPER CHEST SCC IN SITU TX WITH BX   SCC (squamous cell carcinoma) 01/01/2018   LEFT SIDEBURN SCC TX CX3 5FU   SCC (squamous cell carcinoma) 01/01/2018   RIGHT LOWER LIP SCC IN SITU TX CX3 5FU   SCC (squamous cell carcinoma) 10/01/2018   LEFT CHEEK SCC IN SITU TX CX3 5FU   SCC (squamous cell carcinoma) 10/01/2018   LEFT INNER CHEEK SCC IN SITU TX CX3 5FU    Squamous cell carcinoma of skin 05/16/2007   RIGHT JAWLINE SCC IN SITU TX CX3 5FU  Past Surgical History:  Procedure Laterality Date   BACK SURGERY  2004   2 disc in neck   CHOLECYSTECTOMY  2008   lap chole   KNEE ARTHROSCOPY Right 2006   PAROTIDECTOMY Right 07/20/2014   Procedure: PAROTIDECTOMY;  Surgeon: Newman Pies, MD;  Location: [facility];  Service: ENT;  Laterality: Right;   TOTAL KNEE ARTHROPLASTY Right 02/24/2015   Procedure: TOTAL RIGHT KNEE ARTHROPLASTY;  Surgeon: Ollen Gross, MD;  Location: [facility];  Service: Orthopedics;  Laterality: Right;    Family History  Problem Relation Age of Onset   Cancer Mother    Basal cell carcinoma Mother    Heart attack Father    Thyroid cancer Father    Social History:  reports that she has never smoked. She has never used smokeless tobacco. She reports that she does not drink alcohol and does not use drugs.  Allergies:  Allergies   Allergen Reactions   Doxycycline Nausea And Vomiting    Medications Prior to Admission  Medication Sig Dispense Refill   acetaminophen (TYLENOL) 500 MG tablet Take 1,000 mg by mouth every 6 (six) hours as needed (pain.).     Cholecalciferol (VITAMIN D-3 PO) Take 1 tablet by mouth in the morning.     levothyroxine (SYNTHROID, LEVOTHROID) 112 MCG tablet Take 112 mcg by mouth daily before breakfast.     venlafaxine XR (EFFEXOR-XR) 75 MG 24 hr capsule Take 75 mg by mouth 2 (two) times daily.      No results found for this or any previous visit (from the past 48 hours). No results found.  Review of Systems  Constitutional:  Positive for activity change.  Musculoskeletal:  Positive for myalgias and neck pain.  Neurological:  Positive for weakness and numbness.  All other systems reviewed and are negative.   Blood pressure (!) 189/96, pulse 76, temperature 97.8 F (36.6 C), temperature source Oral, resp. rate 18, height 5\' 8"  (1.727 m), weight 72.6 kg, SpO2 99%. Physical Exam Constitutional:      Appearance: Normal appearance.  HENT:     Head: Normocephalic and atraumatic.     Right Ear: Tympanic membrane, ear canal and external ear normal.     Left Ear: Tympanic membrane, ear canal and external ear normal.     Nose: Nose normal.     Mouth/Throat:     Mouth: Mucous membranes are moist.     Pharynx: Oropharynx is clear.  Eyes:     Extraocular Movements: Extraocular movements intact.     Conjunctiva/sclera: Conjunctivae normal.     Pupils: Pupils are equal, round, and reactive to light.  Cardiovascular:     Rate and Rhythm: Normal rate and regular rhythm.     Pulses: Normal pulses.     Heart sounds: Normal heart sounds.  Pulmonary:     Effort: Pulmonary effort is normal.     Breath sounds: Normal breath sounds.  Abdominal:     General: Abdomen is flat. Bowel sounds are normal.     Palpations: Abdomen is soft.  Musculoskeletal:     Cervical back: Normal range of motion and  neck supple.     Comments: Of motion of the cervical spine reveals good mobility is turning 60 degrees left and right flexion extension intact 80% of normal.  Neurological:     Mental Status: She is alert.     Comments: Mild weakness in the deltoid on the right 4 out of 5.  Biceps triceps grips and intrinsics are  all intact on both sides.  Deep tendon reflexes are 1+ in the biceps 2+ and triceps 1+ at brachioradialis.  Lower extremity strength and reflexes are normal.  Cranial nerve examination is normal.  Psychiatric:        Mood and Affect: Mood normal.        Behavior: Behavior normal.        Thought Content: Thought content normal.        Judgment: Judgment normal.      Assessment/Plan Cervical radiculopathy C5 on the right secondary to spondylitic disease at C4-C5.  Plan: Posterior decompression C4-C5 using Metrix retractor and MicroVit surgical technique  Stefani Dama, MD 03/15/2023, 2:29 PM

## 2023-03-15 NOTE — Op Note (Signed)
Date of surgery: 03/15/2023 Preoperative diagnosis: Cervical radiculopathy C5 nerve root on right Postoperative diagnosis: Same Procedure: Right C4-5 laminotomy and foraminotomies decompression of the C5 nerve root.  Metrix retractor.  Operating microscope. Surgeon: Barnett Abu, MD Assistant: Hoyt Koch, MD Anesthesia: General endotracheal Indications: Maria Rogers is a 78 year old individual whose had significant right shoulder and arm pain she has evidence of a right C5 radiculopathy secondary to spondylitic overgrowth causing foraminal stenosis on that right C5 nerve root.  After careful consideration of her options I advised surgical decompression with a laminotomy and foraminotomies using a Metrix retractor.  This is now being performed.  Procedure the patient was brought to the operating room supine on the stretcher.  After the smooth induction of general tracheal anesthesia she was placed in the 3 pin headrest.  She was then carefully turned prone onto the [place] table.  The head was secured the bony prominences were appropriately padded and protected.  Then with the shoulders taped inferiorly I prepped the back of the neck with alcohol DuraPrep and draped in a sterile fashion.  Fluoroscopic guidance was used to localize the area of C4-5 on the right side.  After injecting a small amount of lidocaine with epinephrine mixed 50-50 with half percent Marcaine vertical incision was created in the chosen area and a K wire was passed down to the interlaminar space on the right side at the junction of the facets at C4-C5.  Then a series of dilators was used to enlarge this area to a total diameter of 18 mm.  An 18 mm x 5 cm Metrix retractor tube was then placed over this and the inner tubes were removed the retractor was secured to the operating table with a clamp and final radiographic confirmation of the positioning was obtained.  Then the operating microscope was brought into the field and some  soft tissues overlying this fairly spondylitic facet was performed a high-speed drill with a 2 mm dissecting tool was then used to create a laminotomy and a foraminotomies removing partially the facet at C4-C5.  The common dural tube was identified there was thick Gromis material over this that was removed using a 2 mm Kerrison punch.  This was continued until the takeoff of the C5 nerve root could be identified and then a foraminotomy was created over the C5 root by increasing the size of the facetectomy.  In the end the path of the C5 nerve root was cleared far into the foramen.  Soft tissues from the lateral and ventral aspects around the nerve root were released.  There was noted to be a significant amount of adhesions of the dura to this region but no CSF leaks were encountered.  Once the decompression was noted to be adequate by myself and Dr. Hoyt Koch who performed some of the lateral dissection of from his vantage point through the microscope we then removed the Metrix tube injected about 10 cc of half percent Marcaine into the deep fascia and then closed the fascia with 3-0 Vicryl interrupted fashion 3-0 Vicryl subcuticular skin Dermabond was placed on the skin blood loss for the procedure was estimated 20 cc.  The patient was then removed from the 3 pin headrest and turned back supine and then returned to the recovery room.

## 2023-03-15 NOTE — Anesthesia Postprocedure Evaluation (Signed)
Anesthesia Post Note  Patient: Maria Rogers  Procedure(s) Performed: Foraminotomy and laminotomy - Cervical four-Cervical five - right (Right: Back)     Patient location during evaluation: PACU Anesthesia Type: General Level of consciousness: awake and alert Pain management: pain level controlled Vital Signs Assessment: post-procedure vital signs reviewed and stable Respiratory status: spontaneous breathing, nonlabored ventilation and respiratory function stable Cardiovascular status: blood pressure returned to baseline and stable Postop Assessment: no apparent nausea or vomiting Anesthetic complications: no   No notable events documented.  Last Vitals:  Vitals:   03/15/23 1716 03/15/23 1733  BP: (!) 180/93 (!) 172/82  Pulse: 80 73  Resp: (!) 22 12  Temp:    SpO2: 91% 96%    Last Pain:  Vitals:   03/15/23 1716  TempSrc:   PainSc: 10-Worst pain ever    LLE Motor Response: Purposeful movement (03/15/23 1733) LLE Sensation: Full sensation (03/15/23 1733) RLE Motor Response: Purposeful movement (03/15/23 1733) RLE Sensation: Full sensation (03/15/23 1733)      Collene Schlichter

## 2023-03-15 NOTE — Progress Notes (Signed)
Orthopedic Tech Progress Note Patient Details:  Maria Rogers 12-Nov-1945 [id number]  Ortho Devices Type of Ortho Device: Soft collar Ortho Device/Splint Location: neck Ortho Device/Splint Interventions: Ordered, Application, Adjustment   Post Interventions Patient Tolerated: Well Instructions Provided: Care of device, Adjustment of device  Murrell Dome Carmine Savoy 03/15/2023, 6:08 PM

## 2023-03-15 NOTE — Anesthesia Preprocedure Evaluation (Signed)
Anesthesia Evaluation  Patient identified by MRN, date of birth, ID band Patient awake    Reviewed: Allergy & Precautions, NPO status , Patient's Chart, lab work & pertinent test results  Airway Mallampati: II  TM Distance: >3 FB Neck ROM: Full    Dental no notable dental hx.    Pulmonary neg pulmonary ROS   Pulmonary exam normal breath sounds clear to auscultation       Cardiovascular negative cardio ROS Normal cardiovascular exam Rhythm:Regular Rate:Normal     Neuro/Psych    Depression    negative neurological ROS  negative psych ROS   GI/Hepatic negative GI ROS, Neg liver ROS,,,  Endo/Other  Hypothyroidism    Renal/GU negative Renal ROS  negative genitourinary   Musculoskeletal  (+) Arthritis , Osteoarthritis,    Abdominal   Peds negative pediatric ROS (+)  Hematology negative hematology ROS (+)   Anesthesia Other Findings   Reproductive/Obstetrics negative OB ROS                             Anesthesia Physical Anesthesia Plan  ASA: II  Anesthesia Plan: General   Post-op Pain Management:    Induction: Intravenous  PONV Risk Score and Plan: 3 and Ondansetron, Dexamethasone, Midazolam and Treatment may vary due to age or medical condition  Airway Management Planned: Oral ETT  Additional Equipment:   Intra-op Plan:   Post-operative Plan: Extubation in OR  Informed Consent: I have reviewed the patients History and Physical, chart, labs and discussed the procedure including the risks, benefits and alternatives for the proposed anesthesia with the patient or authorized representative who has indicated his/her understanding and acceptance.     Dental advisory given  Plan Discussed with: CRNA and Surgeon  Anesthesia Plan Comments:         Anesthesia Quick Evaluation

## 2023-03-15 NOTE — Anesthesia Procedure Notes (Signed)
Procedure Name: Intubation Date/Time: 03/15/2023 3:19 PM  Performed by: Georgianne Fick D, CRNAPre-anesthesia Checklist: Patient identified, Emergency Drugs available, Suction available and Patient being monitored Patient Re-evaluated:Patient Re-evaluated prior to induction Oxygen Delivery Method: Circle System Utilized Preoxygenation: Pre-oxygenation with 100% oxygen Induction Type: IV induction Ventilation: Mask ventilation without difficulty Laryngoscope Size: Mac and 3 Grade View: Grade I Tube type: Oral Tube size: 7.0 mm Number of attempts: 1 Airway Equipment and Method: Stylet and Oral airway Placement Confirmation: ETT inserted through vocal cords under direct vision, positive ETCO2 and breath sounds checked- equal and bilateral Secured at: 22 cm Tube secured with: Tape Dental Injury: Teeth and Oropharynx as per pre-operative assessment

## 2023-03-15 NOTE — Transfer of Care (Signed)
Immediate Anesthesia Transfer of Care Note  Patient: Maria Rogers  Procedure(s) Performed: Foraminotomy and laminotomy - Cervical four-Cervical five - right (Right: Back)  Patient Location: PACU  Anesthesia Type:General  Level of Consciousness: awake, alert , and oriented  Airway & Oxygen Therapy: Patient Spontanous Breathing and Patient connected to nasal cannula oxygen  Post-op Assessment: Report given to RN and Post -op Vital signs reviewed and stable  Post vital signs: Reviewed and stable  Last Vitals:  Vitals Value Taken Time  BP 166/97 03/15/23 1709  Temp    Pulse 80 03/15/23 1714  Resp 13 03/15/23 1714  SpO2 100 % 03/15/23 1714  Vitals shown include unfiled device data.  Last Pain:  Vitals:   03/15/23 1401  TempSrc:   PainSc: 0-No pain         Complications: No notable events documented.

## 2023-03-16 ENCOUNTER — Encounter (HOSPITAL_COMMUNITY): Payer: Self-pay | Admitting: Neurological Surgery

## 2023-03-16 ENCOUNTER — Other Ambulatory Visit: Payer: Self-pay

## 2023-03-16 DIAGNOSIS — Z96651 Presence of right artificial knee joint: Secondary | ICD-10-CM | POA: Diagnosis not present

## 2023-03-16 DIAGNOSIS — E039 Hypothyroidism, unspecified: Secondary | ICD-10-CM | POA: Diagnosis not present

## 2023-03-16 DIAGNOSIS — Z79899 Other long term (current) drug therapy: Secondary | ICD-10-CM | POA: Diagnosis not present

## 2023-03-16 DIAGNOSIS — Z85828 Personal history of other malignant neoplasm of skin: Secondary | ICD-10-CM | POA: Diagnosis not present

## 2023-03-16 DIAGNOSIS — M4722 Other spondylosis with radiculopathy, cervical region: Secondary | ICD-10-CM | POA: Diagnosis not present

## 2023-03-16 MED ORDER — OXYCODONE-ACETAMINOPHEN 5-325 MG PO TABS: 1.0000 | ORAL_TABLET | ORAL | 0 refills | Status: AC | PRN

## 2023-03-16 MED ORDER — DEXAMETHASONE 1 MG PO TABS: ORAL_TABLET | ORAL | 0 refills | Status: AC

## 2023-03-16 MED ORDER — METHOCARBAMOL 500 MG PO TABS: 500.0000 mg | ORAL_TABLET | Freq: Four times a day (QID) | ORAL | 2 refills | Status: AC | PRN

## 2023-03-16 NOTE — Evaluation (Signed)
Occupational Therapy Evaluation Patient Details Name: Maria Rogers MRN: [id number] DOB: 1945-04-13 Today's Date: 03/16/2023   History of Present Illness Patient is a 78 yo female presenting to the hospital for C4-C5 foraminotomy and laminotomy on 03/15/23. PMH includes: arthritis, cancer, carpel tunnel, depression, previous C6 surgery   Clinical Impression   Prior to this admission, patient independent in ADLs and funcitonal mobility with progressive R shoulder weakness. Currently, patient is at contact gaurd for mobility and ADL management with R shoulder weakness improving. Patient educated on cervical precautions with handout given and lower body dressing completed adhering to precautions. Patient has support from son and grandson at discharge. OT signing off at this time with no further acute OT needs. Patient does not require OT follow up at discharge.       If plan is discharge home, recommend the following: Assist for transportation (initially)    Functional Status Assessment  Patient has had a recent decline in their functional status and demonstrates the ability to make significant improvements in function in a reasonable and predictable amount of time.  Equipment Recommendations  None recommended by OT    Recommendations for Other Services       Precautions / Restrictions Precautions Precautions: Cervical Precaution Comments: educated on cervical precautions. OT to providing handout Required Braces or Orthoses: Cervical Brace Cervical Brace: Soft collar Restrictions Weight Bearing Restrictions Per Provider Order: No      Mobility Bed Mobility               General bed mobility comments: up in recliner    Transfers Overall transfer level: Needs assistance Equipment used: None Transfers: Sit to/from Stand Sit to Stand: Contact guard assist           General transfer comment: completing ADL tasks.      Balance Overall balance assessment: Mild  deficits observed, not formally tested                                         ADL either performed or assessed with clinical judgement   ADL Overall ADL's : Needs assistance/impaired Eating/Feeding: Set up;Sitting   Grooming: Set up;Sitting   Upper Body Bathing: Set up;Sitting   Lower Body Bathing: Contact guard assist;Sitting/lateral leans;Sit to/from stand   Upper Body Dressing : Set up;Sitting   Lower Body Dressing: Contact guard assist;Sitting/lateral leans;Sit to/from stand Lower Body Dressing Details (indicate cue type and reason): donning underwear and pants Toilet Transfer: Contact guard assist;Ambulation   Toileting- Clothing Manipulation and Hygiene: Contact guard assist;Sit to/from stand;Sitting/lateral lean       Functional mobility during ADLs: Contact guard assist;Cueing for safety;Cueing for sequencing General ADL Comments: Prior to this admission, patient independent in ADLs and funcitonal mobility with progressive R shoulder weakness. Currently, patient is at contact gaurd for mobility and ADL management with R shoulder weakness improving. Patient educated on cervical precautions with handout given and lower body dressing completed adhering to precautions. Patient has support from son and grandson at discharge. OT signing off at this time with no further acute OT needs. Patient does not require OT follow up at discharge.     Vision Baseline Vision/History: 1 Wears glasses Ability to See in Adequate Light: 0 Adequate Patient Visual Report: No change from baseline Vision Assessment?: No apparent visual deficits     Perception Perception: Not tested       Praxis  Praxis: Not tested       Pertinent Vitals/Pain Pain Assessment Pain Assessment: No/denies pain     Extremity/Trunk Assessment Upper Extremity Assessment Upper Extremity Assessment: RUE deficits/detail RUE Deficits / Details: R shoulder weakness at deltoid, this is a known  problem, and patient feels it is already improving from surgery RUE: Unable to fully assess due to pain RUE Sensation: WNL RUE Coordination: decreased gross motor;decreased fine motor   Lower Extremity Assessment Lower Extremity Assessment: Defer to PT evaluation   Cervical / Trunk Assessment Cervical / Trunk Assessment: Neck Surgery   Communication Communication Communication: No apparent difficulties Cueing Techniques: Verbal cues   Cognition Arousal: Alert Behavior During Therapy: WFL for tasks assessed/performed Overall Cognitive Status: Within Functional Limits for tasks assessed                                       General Comments  son present at end of session    Exercises     Shoulder Instructions      Home Living Family/patient expects to be discharged to:: Private residence Living Arrangements: Alone Available Help at Discharge: Family;Available 24 hours/day (son plans to stay with pt on d/c) Type of Home: House Home Access: Ramped entrance     Home Layout: One level     Bathroom Shower/Tub: Producer, television/film/video: Handicapped height     Home Equipment: Shower seat          Prior Functioning/Environment Prior Level of Function : Independent/Modified Independent;Driving             Mobility Comments: independent ADLs Comments: independent        OT Problem List: Decreased strength;Decreased range of motion;Decreased activity tolerance;Impaired UE functional use      OT Treatment/Interventions:      OT Goals(Current goals can be found in the care plan section) Acute Rehab OT Goals Patient Stated Goal: to get better OT Goal Formulation: With patient Time For Goal Achievement: 03/30/23 Potential to Achieve Goals: Good  OT Frequency:      Co-evaluation              AM-PAC OT "6 Clicks" Daily Activity     Outcome Measure Help from another person eating meals?: A Little Help from another person taking  care of personal grooming?: A Little Help from another person toileting, which includes using toliet, bedpan, or urinal?: A Little Help from another person bathing (including washing, rinsing, drying)?: A Little Help from another person to put on and taking off regular upper body clothing?: A Little Help from another person to put on and taking off regular lower body clothing?: A Little 6 Click Score: 18   End of Session Nurse Communication: Mobility status  Activity Tolerance: Patient tolerated treatment well Patient left: in chair;with call bell/phone within reach  OT Visit Diagnosis: Unsteadiness on feet (R26.81);Muscle weakness (generalized) (M62.81)                Time: 6045-4098 OT Time Calculation (min): 16 min Charges:  OT General Charges $OT Visit: 1 Visit OT Evaluation $OT Eval Moderate Complexity: 1 Mod  Maria Rogers, OTR/L Acute Rehabilitation Services [phone]   Cherlyn Cushing 03/16/2023, 10:51 AM

## 2023-03-16 NOTE — Discharge Summary (Signed)
Physician Discharge Summary  Patient ID: Maria Rogers MRN: [id number] DOB/AGE: 11-10-1945 78 y.o.  Admit date: 03/15/2023 Discharge date: 03/16/2023  Admission Diagnoses: Cervical radiculopathy C5 right secondary to spondylosis  Discharge Diagnoses: Cervical radiculopathy C5 right secondary to spondylosis C4-C5 Principal Problem:   Cervical spondylosis with radiculopathy   Discharged Condition: good  Hospital Course: Patient tolerated surgery well  Consults: None  Significant Diagnostic Studies: None  Treatments: surgery: See op note  Discharge Exam: Blood pressure 124/67, pulse 76, temperature 97.9 F (36.6 C), temperature source Oral, resp. rate 16, height 5\' 8"  (1.727 m), weight 72.6 kg, SpO2 97%. Incision is clean and dry.  Mild weakness in the deltoid on the right at 4 out of 5.  Tone and bulk are intact.  Incision is stable  Disposition: Discharge disposition: 01-Home or Self Care       Discharge Instructions     Call MD for:  redness, tenderness, or signs of infection (pain, swelling, redness, odor or green/yellow discharge around incision site)   Complete by: As directed    Call MD for:  severe uncontrolled pain   Complete by: As directed    Call MD for:  temperature >100.4   Complete by: As directed    Diet - low sodium heart healthy   Complete by: As directed    Discharge instructions   Complete by: As directed    Okay to shower. Do not apply salves or appointments to incision. No heavy lifting with the upper extremities greater than 15 pounds. May resume driving when not requiring pain medication and patient feels comfortable with doing so.   Incentive spirometry RT   Complete by: As directed    Increase activity slowly   Complete by: As directed       Allergies as of 03/16/2023       Reactions   Doxycycline Nausea And Vomiting        Medication List     TAKE these medications    acetaminophen 500 MG tablet Commonly known as:  TYLENOL Take 1,000 mg by mouth every 6 (six) hours as needed (pain.).   dexamethasone 1 MG tablet Commonly known as: DECADRON 2 tablets twice daily for 2 days, one tablet twice daily for 2 days, one tablet daily for 2 days.   levothyroxine 112 MCG tablet Commonly known as: SYNTHROID Take 112 mcg by mouth daily before breakfast.   methocarbamol 500 MG tablet Commonly known as: ROBAXIN Take 1 tablet (500 mg total) by mouth every 6 (six) hours as needed for muscle spasms.   oxyCODONE-acetaminophen 5-325 MG tablet Commonly known as: PERCOCET/ROXICET Take 1 tablet by mouth every 4 (four) hours as needed for moderate pain (pain score 4-6) or severe pain (pain score 7-10).   venlafaxine XR 75 MG 24 hr capsule Commonly known as: EFFEXOR-XR Take 75 mg by mouth 2 (two) times daily.   VITAMIN D-3 PO Take 1 tablet by mouth in the morning.         Signed: Stefani Dama 03/16/2023, 8:58 AM

## 2023-03-16 NOTE — Evaluation (Signed)
Physical Therapy Evaluation Patient Details Name: Maria Rogers MRN: [id number] DOB: December 14, 1945 Today's Date: 03/16/2023  History of Present Illness  Pt is a 78 y.o. female s/p R C4/5 lami. PMH: OA  Clinical Impression  PT eval complete. Pt mod I bed mobility and transfers. Supervision amb 200' pushing IV pole. Pt educated on cervical precautions. Soft collar in place. Pt reports son will be staying with her on d/c and can provide needed level of assist. All education complete. Pt to d/c home today. No further skilled PT intervention indicated. PT signing off.         If plan is discharge home, recommend the following: Assistance with cooking/housework;Help with stairs or ramp for entrance;Assist for transportation   Can travel by private vehicle        Equipment Recommendations None recommended by PT  Recommendations for Other Services       Functional Status Assessment Patient has had a recent decline in their functional status and demonstrates the ability to make significant improvements in function in a reasonable and predictable amount of time.     Precautions / Restrictions Precautions Precautions: Cervical Precaution Comments: educated on cervical precautions. OT to provide handouts. Required Braces or Orthoses: Cervical Brace Cervical Brace: Soft collar      Mobility  Bed Mobility Overal bed mobility: Modified Independent                  Transfers Overall transfer level: Modified independent Equipment used: None                    Ambulation/Gait Ambulation/Gait assistance: Supervision Gait Distance (Feet): 200 Feet Assistive device: IV Pole Gait Pattern/deviations: Step-through pattern, Decreased stride length Gait velocity: decreased Gait velocity interpretation: <1.31 ft/sec, indicative of household ambulator   General Gait Details: mildly unsteady but no overt LOB  Stairs            Wheelchair Mobility     Tilt Bed     Modified Rankin (Stroke Patients Only)       Balance Overall balance assessment: Mild deficits observed, not formally tested                                           Pertinent Vitals/Pain Pain Assessment Pain Assessment: No/denies pain    Home Living Family/patient expects to be discharged to:: Private residence Living Arrangements: Alone Available Help at Discharge: Family;Available 24 hours/day (son plans to stay with pt on d/c) Type of Home: House Home Access: Ramped entrance       Home Layout: One level Home Equipment: Shower seat      Prior Function Prior Level of Function : Independent/Modified Independent;Driving                     Extremity/Trunk Assessment   Upper Extremity Assessment Upper Extremity Assessment: Defer to OT evaluation    Lower Extremity Assessment Lower Extremity Assessment: Overall WFL for tasks assessed    Cervical / Trunk Assessment Cervical / Trunk Assessment: Neck Surgery  Communication   Communication Communication: No apparent difficulties  Cognition Arousal: Alert Behavior During Therapy: WFL for tasks assessed/performed Overall Cognitive Status: Within Functional Limits for tasks assessed  General Comments      Exercises     Assessment/Plan    PT Assessment Patient does not need any further PT services  PT Problem List         PT Treatment Interventions      PT Goals (Current goals can be found in the Care Plan section)  Acute Rehab PT Goals Patient Stated Goal: home PT Goal Formulation: All assessment and education complete, DC therapy    Frequency       Co-evaluation               AM-PAC PT "6 Clicks" Mobility  Outcome Measure Help needed turning from your back to your side while in a flat bed without using bedrails?: None Help needed moving from lying on your back to sitting on the side of a flat bed without  using bedrails?: None Help needed moving to and from a bed to a chair (including a wheelchair)?: None Help needed standing up from a chair using your arms (e.g., wheelchair or bedside chair)?: None Help needed to walk in hospital room?: A Little Help needed climbing 3-5 steps with a railing? : A Little 6 Click Score: 22    End of Session Equipment Utilized During Treatment: Gait belt Activity Tolerance: Patient tolerated treatment well Patient left: in chair;with call bell/phone within reach Nurse Communication: Mobility status PT Visit Diagnosis: Difficulty in walking, not elsewhere classified (R26.2)    Time: 2956-2130 PT Time Calculation (min) (ACUTE ONLY): 14 min   Charges:   PT Evaluation $PT Eval Low Complexity: 1 Low   PT General Charges $$ ACUTE PT VISIT: 1 Visit         Ferd Glassing., PT  Office # [phone]   Ilda Foil 03/16/2023, 9:27 AM

## 2023-03-16 NOTE — TOC Transition Note (Signed)
Transition of Care [facility]) - Discharge Note   Patient Details  Name: Maria Rogers MRN: [id number] Date of Birth: 10-20-1945  [facility] of [facility]) CM/SW Contact:  Kermit Balo, RN Phone Number: 03/16/2023, 11:01 AM   Clinical Narrative:     Pt is discharging home with self care. No needs per TOC.   Final next level of care: Home/Self Care Barriers to Discharge: No Barriers Identified   Patient Goals and CMS Choice            Discharge Placement                       Discharge Plan and Services Additional resources added to the After Visit Summary for                                       Social Drivers of Health (SDOH) Interventions SDOH Screenings   Food Insecurity: No Food Insecurity (03/15/2023)  Housing: Low Risk  (03/15/2023)  Transportation Needs: No Transportation Needs (03/15/2023)  Utilities: Not At Risk (03/15/2023)  Social Connections: Moderately Integrated (03/15/2023)  Tobacco Use: Low Risk  (03/15/2023)     Readmission Risk Interventions     No data to display

## 2023-04-20 DIAGNOSIS — D37030 Neoplasm of uncertain behavior of the parotid salivary glands: Secondary | ICD-10-CM | POA: Diagnosis not present

## 2023-04-20 DIAGNOSIS — C07 Malignant neoplasm of parotid gland: Secondary | ICD-10-CM | POA: Diagnosis not present

## 2023-06-08 DIAGNOSIS — E782 Mixed hyperlipidemia: Secondary | ICD-10-CM | POA: Diagnosis not present

## 2023-06-08 DIAGNOSIS — Z0001 Encounter for general adult medical examination with abnormal findings: Secondary | ICD-10-CM | POA: Diagnosis not present

## 2023-06-08 DIAGNOSIS — Z1321 Encounter for screening for nutritional disorder: Secondary | ICD-10-CM | POA: Diagnosis not present

## 2023-06-08 DIAGNOSIS — I10 Essential (primary) hypertension: Secondary | ICD-10-CM | POA: Diagnosis not present

## 2023-06-08 DIAGNOSIS — E7849 Other hyperlipidemia: Secondary | ICD-10-CM | POA: Diagnosis not present

## 2023-06-08 DIAGNOSIS — E559 Vitamin D deficiency, unspecified: Secondary | ICD-10-CM | POA: Diagnosis not present

## 2023-06-18 ENCOUNTER — Encounter (INDEPENDENT_AMBULATORY_CARE_PROVIDER_SITE_OTHER): Payer: Self-pay

## 2023-06-18 ENCOUNTER — Ambulatory Visit (INDEPENDENT_AMBULATORY_CARE_PROVIDER_SITE_OTHER): Payer: Medicare PPO | Admitting: Otolaryngology

## 2023-06-18 VITALS — Ht 68.0 in | Wt 160.0 lb

## 2023-06-18 DIAGNOSIS — H903 Sensorineural hearing loss, bilateral: Secondary | ICD-10-CM

## 2023-06-18 DIAGNOSIS — H6121 Impacted cerumen, right ear: Secondary | ICD-10-CM | POA: Diagnosis not present

## 2023-06-18 DIAGNOSIS — H9193 Unspecified hearing loss, bilateral: Secondary | ICD-10-CM | POA: Diagnosis not present

## 2023-06-18 DIAGNOSIS — H608X3 Other otitis externa, bilateral: Secondary | ICD-10-CM

## 2023-06-18 DIAGNOSIS — C07 Malignant neoplasm of parotid gland: Secondary | ICD-10-CM

## 2023-06-18 MED ORDER — MOMETASONE FUROATE 0.1 % EX CREA: TOPICAL_CREAM | CUTANEOUS | 3 refills | Status: AC

## 2023-06-18 NOTE — Progress Notes (Signed)
 Patient ID: Maria Rogers, female   DOB: [birth year], 78 y.o.   MRN: [id number]  Follow up: Parotid cancer, hearing loss, cerumen impaction, eczematous otitis externa   HPI: The patient is a 78 year old female who returns today for her follow-up evaluation. 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  She underwent right parotidectomy surgery in 2016.  The primary of the lesion was likely from her skin cancer.  In addition, the patient was also treated for her eczematous otitis externa, bilateral high-frequency hearing loss, and recurrent cerumen impaction.  The patient returns today reporting persistent itchy sensation in her ears.  She denies any change in her hearing.  She is tolerating oral intake well.  Exam: General: Communicates without difficulty, well nourished, no acute distress. Head: Normocephalic, no evidence injury, no tenderness, facial buttresses intact without stepoff. Face/sinus: No tenderness to palpation and percussion. Facial movement is normal and symmetric. Eyes: PERRL, EOMI. No scleral icterus, conjunctivae clear. Neuro: CN II exam reveals vision grossly intact.  No nystagmus at any point of gaze. Ears: Auricles well formed without lesions.  Right ear cerumen impaction. Nose: External evaluation reveals normal support and skin without lesions.  Dorsum is intact.  Anterior rhinoscopy reveals pink mucosa over anterior aspect of inferior turbinates and intact septum.  No purulence noted. Oral:  Oral cavity and oropharynx are intact, symmetric, without erythema or edema.  Mucosa is moist without lesions.  Neck: Full range of motion without pain. The patient's right parotid incision is well healed.  There is no significant lymphadenopathy.  No masses palpable.  Thyroid  bed within normal limits to palpation.  Trachea is midline. Neuro:  CN 2-12 grossly intact. Gait normal.  Procedure: Right ear cerumen disimpaction Anesthesia:  None Description: Under the operating microscope, the cerumen is carefully removed with a combination of cerumen currette, alligator forceps, and suction catheters.  After the cerumen is removed, the TMs are noted to be normal.  Eczematous changes are noted in both ear canals.  No mass, erythema, or lesions. The patient tolerated the procedure well.   Assessment: 1.  Recurrent right ear cerumen impaction.  This may be secondary to postradiation effect.  After the cerumen removal procedure, both tympanic membranes are noted to be normal. 2.  Bilateral chronic eczematous otitis externa. 3.  No recurrent parotid squamous cell carcinoma is noted. 4.  Subjectively stable bilateral high-frequency hearing loss.  Plan: 1.  Otomicroscopy with right ear cerumen disimpaction. 2.  The physical exam findings are reviewed with the patient. 3.  Elocon refill to treat the chronic eczematous otitis externa. 4.  Continue the use of her hearing aids. 5.  The patient will return for reevaluation in 6 months.

## 2023-06-21 DIAGNOSIS — L570 Actinic keratosis: Secondary | ICD-10-CM | POA: Diagnosis not present

## 2023-06-21 DIAGNOSIS — Z85828 Personal history of other malignant neoplasm of skin: Secondary | ICD-10-CM | POA: Diagnosis not present

## 2023-06-21 DIAGNOSIS — L820 Inflamed seborrheic keratosis: Secondary | ICD-10-CM | POA: Diagnosis not present

## 2023-06-21 DIAGNOSIS — Z08 Encounter for follow-up examination after completed treatment for malignant neoplasm: Secondary | ICD-10-CM | POA: Diagnosis not present

## 2023-06-21 DIAGNOSIS — X32XXXD Exposure to sunlight, subsequent encounter: Secondary | ICD-10-CM | POA: Diagnosis not present

## 2023-06-21 DIAGNOSIS — D485 Neoplasm of uncertain behavior of skin: Secondary | ICD-10-CM | POA: Diagnosis not present

## 2023-06-21 DIAGNOSIS — D225 Melanocytic nevi of trunk: Secondary | ICD-10-CM | POA: Diagnosis not present

## 2023-06-21 DIAGNOSIS — Z1283 Encounter for screening for malignant neoplasm of skin: Secondary | ICD-10-CM | POA: Diagnosis not present

## 2023-10-08 DIAGNOSIS — H43393 Other vitreous opacities, bilateral: Secondary | ICD-10-CM | POA: Diagnosis not present

## 2023-11-29 DIAGNOSIS — E7849 Other hyperlipidemia: Secondary | ICD-10-CM | POA: Diagnosis not present

## 2023-11-29 DIAGNOSIS — M1712 Unilateral primary osteoarthritis, left knee: Secondary | ICD-10-CM | POA: Diagnosis not present

## 2023-11-29 DIAGNOSIS — Z1331 Encounter for screening for depression: Secondary | ICD-10-CM | POA: Diagnosis not present

## 2023-11-29 DIAGNOSIS — E559 Vitamin D deficiency, unspecified: Secondary | ICD-10-CM | POA: Diagnosis not present

## 2023-11-29 DIAGNOSIS — Z0001 Encounter for general adult medical examination with abnormal findings: Secondary | ICD-10-CM | POA: Diagnosis not present

## 2023-11-29 DIAGNOSIS — Z23 Encounter for immunization: Secondary | ICD-10-CM | POA: Diagnosis not present

## 2023-11-29 DIAGNOSIS — E782 Mixed hyperlipidemia: Secondary | ICD-10-CM | POA: Diagnosis not present

## 2023-11-29 DIAGNOSIS — Z1389 Encounter for screening for other disorder: Secondary | ICD-10-CM | POA: Diagnosis not present

## 2023-11-29 DIAGNOSIS — E039 Hypothyroidism, unspecified: Secondary | ICD-10-CM | POA: Diagnosis not present

## 2023-12-05 DIAGNOSIS — Z1231 Encounter for screening mammogram for malignant neoplasm of breast: Secondary | ICD-10-CM | POA: Diagnosis not present

## 2023-12-21 ENCOUNTER — Ambulatory Visit (INDEPENDENT_AMBULATORY_CARE_PROVIDER_SITE_OTHER): Admitting: Otolaryngology

## 2024-03-12 ENCOUNTER — Ambulatory Visit (INDEPENDENT_AMBULATORY_CARE_PROVIDER_SITE_OTHER): Admitting: Otolaryngology

## 2024-03-21 ENCOUNTER — Ambulatory Visit (INDEPENDENT_AMBULATORY_CARE_PROVIDER_SITE_OTHER): Payer: Self-pay

## 2024-03-21 ENCOUNTER — Ambulatory Visit (INDEPENDENT_AMBULATORY_CARE_PROVIDER_SITE_OTHER): Admitting: Otolaryngology

## 2024-03-21 ENCOUNTER — Encounter (INDEPENDENT_AMBULATORY_CARE_PROVIDER_SITE_OTHER): Payer: Self-pay | Admitting: Otolaryngology

## 2024-03-21 VITALS — BP 137/88 | HR 80

## 2024-03-21 DIAGNOSIS — H903 Sensorineural hearing loss, bilateral: Secondary | ICD-10-CM

## 2024-03-21 DIAGNOSIS — H6121 Impacted cerumen, right ear: Secondary | ICD-10-CM

## 2024-03-21 DIAGNOSIS — Z85819 Personal history of malignant neoplasm of unspecified site of lip, oral cavity, and pharynx: Secondary | ICD-10-CM | POA: Insufficient documentation

## 2024-03-21 DIAGNOSIS — H608X3 Other otitis externa, bilateral: Secondary | ICD-10-CM

## 2024-03-21 MED ORDER — MOMETASONE FUROATE 0.1 % EX CREA: TOPICAL_CREAM | CUTANEOUS | 3 refills | Status: AC

## 2024-03-21 NOTE — Progress Notes (Signed)
" °  [address], [address] [place], [place] [address] [phone]  Hearing Aid Check     Maria Rogers comes for a scheduled appointment for a hearing aid check.  Time in: 1015 Time out: 1030 Accompanied ab:[id number]   Right Left  Hearing aid manufacturer [employer] SN:  [id number] [employer] SN:  [id number]  Hearing aid style RIC RIC  Hearing aid battery rechargeable rechargeable  Receiver    Dome/ custom earpiece    Retention wire yes yes  Warranty expiration date 07/09/2023 07/09/2023  Loss and Damage expired expired  Additional accessories Expiration date    Initial fitting date 06/10/2020 06/10/2020  Device was fit at: [facility]  Hearing aid services Bundled until:  Bundled until:    Chief Complaint:   Patient reported that the left hearing aid was not functioning.  Actions Taken:   Inspection and listening check revealed that the left dome and wax guard were occluded with cerumen, resulting in no audible output. The right hearing aid was functioning properly with good sound quality.   Domes and wax guards were replaced on both devices. Following replacement, the left hearing aid produced appropriate sound with no distortion. The patient inserted both devices and confirmed that they were functioning well with satisfactory sound quality.  The patient had questions regarding phone connectivity and the use of hearing aid app programs. Time was spent practicing how to answer phone calls while wearing the hearing aids. Additional time was dedicated to explaining the background noise program and the use of the phone as a remote microphone. The patients previous audiogram was reviewed, including discussion of fair word recognition scores bilaterally.  Because the patient was unaware that the warranty had expired and that service charges apply, todays services were provided at no cost as a courtesy. The patient was informed that  service charges will apply for future visits.  The need for updated diagnostic information was discussed. A new hearing evaluation and speech mapping are recommended to determine whether the hearing aids are meeting the best prescription targets.  Services fee: $0 was paid at checkout.  Recommend: Patient to return for an updated audiogram and speech mapping to ensure hearing aids functioning at a correct prescription level.    Maria Rogers, AUD "

## 2024-03-21 NOTE — Progress Notes (Signed)
 Patient ID: Maria Rogers, female   DOB: 04-16-1945, 79 y.o.   MRN: [id number]  Follow up: Parotid cancer, progressive hearing loss, eczematous otitis externa   HPI: The patient is an 79 year old female who returns today for her follow-up evaluation. 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  She underwent right parotidectomy surgery in 2016.  The primary of the lesion was likely from her skin cancer.  In addition, the patient was also treated for her eczematous otitis externa, bilateral high-frequency hearing loss, and recurrent cerumen impaction.  The patient returns today reporting persistent itchy sensation in her ears.  She denies any change in her hearing.  She is currently wearing bilateral hearing aids.  She is tolerating oral intake well.  Exam: General: Communicates without difficulty, well nourished, no acute distress. Head: Normocephalic, no evidence injury, no tenderness, facial buttresses intact without stepoff. Face/sinus: No tenderness to palpation and percussion. Facial movement is normal and symmetric. Eyes: PERRL, EOMI. No scleral icterus, conjunctivae clear. Neuro: CN II exam reveals vision grossly intact.  No nystagmus at any point of gaze. Ears: Auricles well formed without lesions.  Right ear cerumen impaction. Nose: External evaluation reveals normal support and skin without lesions.  Dorsum is intact.  Anterior rhinoscopy reveals pink mucosa over anterior aspect of inferior turbinates and intact septum.  No purulence noted. Oral:  Oral cavity and oropharynx are intact, symmetric, without erythema or edema.  Mucosa is moist without lesions.  Neck: Full range of motion without pain. The patients right parotid incision is well healed.  There is no significant lymphadenopathy.  No masses palpable.  Thyroid  bed within normal limits to palpation.  Trachea is midline. Neuro:  CN 2-12 grossly intact. Gait normal.  Procedure:  Right ear cerumen disimpaction Anesthesia: None Description: Under the operating microscope, the cerumen is carefully removed with a combination of cerumen currette, alligator forceps, and suction catheters.  After the cerumen is removed, the TMs are noted to be normal.  Eczematous changes are noted in both ear canals.  No mass, erythema, or lesions. The patient tolerated the procedure well.   Assessment: 1.  Recurrent right ear cerumen impaction.  This may be secondary to postradiation effect.  After the cerumen removal procedure, both tympanic membranes are noted to be normal. 2.  Bilateral chronic eczematous otitis externa. 3.  No recurrent parotid squamous cell carcinoma is noted. 4.  Subjectively stable bilateral high-frequency hearing loss.  Plan: 1.  Otomicroscopy with right ear cerumen disimpaction. 2.  The physical exam findings are reviewed with the patient. 3.  Elocon  refill to treat the chronic eczematous otitis externa. 4.  Continue the use of her hearing aids. 5.  The patient will return for reevaluation in 6 months.

## 2024-09-19 ENCOUNTER — Ambulatory Visit (INDEPENDENT_AMBULATORY_CARE_PROVIDER_SITE_OTHER): Admitting: Otolaryngology
